# Patient Record
Sex: Female | Born: 1940 | Race: White | Hispanic: No | Smoking: Former smoker
Health system: Southern US, Community
[De-identification: ages and names within clinical notes are randomized; demographics above are authoritative.]

## PROBLEM LIST (undated history)

## (undated) DIAGNOSIS — F329 Major depressive disorder, single episode, unspecified: Secondary | ICD-10-CM

## (undated) DIAGNOSIS — Z9989 Dependence on other enabling machines and devices: Secondary | ICD-10-CM

## (undated) DIAGNOSIS — T8859XA Other complications of anesthesia, initial encounter: Secondary | ICD-10-CM

## (undated) DIAGNOSIS — T4145XA Adverse effect of unspecified anesthetic, initial encounter: Secondary | ICD-10-CM

## (undated) DIAGNOSIS — N200 Calculus of kidney: Secondary | ICD-10-CM

## (undated) DIAGNOSIS — F32A Depression, unspecified: Secondary | ICD-10-CM

## (undated) DIAGNOSIS — M8580 Other specified disorders of bone density and structure, unspecified site: Secondary | ICD-10-CM

## (undated) DIAGNOSIS — G4730 Sleep apnea, unspecified: Secondary | ICD-10-CM

## (undated) DIAGNOSIS — N3281 Overactive bladder: Secondary | ICD-10-CM

## (undated) DIAGNOSIS — K219 Gastro-esophageal reflux disease without esophagitis: Secondary | ICD-10-CM

## (undated) DIAGNOSIS — E7800 Pure hypercholesterolemia, unspecified: Secondary | ICD-10-CM

## (undated) DIAGNOSIS — K5792 Diverticulitis of intestine, part unspecified, without perforation or abscess without bleeding: Secondary | ICD-10-CM

## (undated) DIAGNOSIS — M5430 Sciatica, unspecified side: Secondary | ICD-10-CM

## (undated) DIAGNOSIS — E669 Obesity, unspecified: Secondary | ICD-10-CM

## (undated) HISTORY — DX: Gastro-esophageal reflux disease without esophagitis: K21.9

## (undated) HISTORY — DX: Diverticulitis of intestine, part unspecified, without perforation or abscess without bleeding: K57.92

## (undated) HISTORY — DX: Other specified disorders of bone density and structure, unspecified site: M85.80

## (undated) HISTORY — DX: Overactive bladder: N32.81

## (undated) HISTORY — DX: Depression, unspecified: F32.A

## (undated) HISTORY — DX: Obesity, unspecified: E66.9

## (undated) HISTORY — DX: Calculus of kidney: N20.0

## (undated) HISTORY — DX: Pure hypercholesterolemia, unspecified: E78.00

## (undated) HISTORY — DX: Sleep apnea, unspecified: G47.30

## (undated) HISTORY — DX: Dependence on other enabling machines and devices: Z99.89

## (undated) HISTORY — DX: Major depressive disorder, single episode, unspecified: F32.9

## (undated) HISTORY — DX: Sciatica, unspecified side: M54.30

---

## 1998-09-05 ENCOUNTER — Other Ambulatory Visit: Admission: RE | Admit: 1998-09-05 | Discharge: 1998-09-05 | Payer: Self-pay | Admitting: Obstetrics and Gynecology

## 1999-05-15 ENCOUNTER — Encounter: Payer: Self-pay | Admitting: Geriatric Medicine

## 1999-05-15 ENCOUNTER — Encounter: Admission: RE | Admit: 1999-05-15 | Discharge: 1999-05-15 | Payer: Self-pay | Admitting: Geriatric Medicine

## 1999-05-22 ENCOUNTER — Encounter: Payer: Self-pay | Admitting: *Deleted

## 1999-05-22 ENCOUNTER — Emergency Department (HOSPITAL_COMMUNITY): Admission: EM | Admit: 1999-05-22 | Discharge: 1999-05-22 | Payer: Self-pay | Admitting: *Deleted

## 1999-12-05 ENCOUNTER — Other Ambulatory Visit: Admission: RE | Admit: 1999-12-05 | Discharge: 1999-12-05 | Payer: Self-pay | Admitting: Obstetrics and Gynecology

## 2001-01-05 ENCOUNTER — Other Ambulatory Visit: Admission: RE | Admit: 2001-01-05 | Discharge: 2001-01-05 | Payer: Self-pay | Admitting: Obstetrics and Gynecology

## 2001-09-28 ENCOUNTER — Encounter: Payer: Self-pay | Admitting: Geriatric Medicine

## 2001-09-28 ENCOUNTER — Encounter: Admission: RE | Admit: 2001-09-28 | Discharge: 2001-09-28 | Payer: Self-pay | Admitting: Geriatric Medicine

## 2003-12-25 ENCOUNTER — Encounter: Admission: RE | Admit: 2003-12-25 | Discharge: 2003-12-25 | Payer: Self-pay | Admitting: Geriatric Medicine

## 2007-08-25 ENCOUNTER — Encounter: Admission: RE | Admit: 2007-08-25 | Discharge: 2007-08-25 | Payer: Self-pay | Admitting: Geriatric Medicine

## 2010-01-31 ENCOUNTER — Encounter: Admission: RE | Admit: 2010-01-31 | Discharge: 2010-01-31 | Payer: Self-pay | Admitting: Geriatric Medicine

## 2011-10-12 DIAGNOSIS — Z79899 Other long term (current) drug therapy: Secondary | ICD-10-CM | POA: Diagnosis not present

## 2011-10-22 DIAGNOSIS — M2010 Hallux valgus (acquired), unspecified foot: Secondary | ICD-10-CM | POA: Diagnosis not present

## 2011-12-25 DIAGNOSIS — Z1231 Encounter for screening mammogram for malignant neoplasm of breast: Secondary | ICD-10-CM | POA: Diagnosis not present

## 2012-03-30 DIAGNOSIS — L821 Other seborrheic keratosis: Secondary | ICD-10-CM | POA: Diagnosis not present

## 2012-06-01 DIAGNOSIS — Z23 Encounter for immunization: Secondary | ICD-10-CM | POA: Diagnosis not present

## 2012-06-29 DIAGNOSIS — R05 Cough: Secondary | ICD-10-CM | POA: Diagnosis not present

## 2012-06-29 DIAGNOSIS — G479 Sleep disorder, unspecified: Secondary | ICD-10-CM | POA: Diagnosis not present

## 2012-06-29 DIAGNOSIS — R5381 Other malaise: Secondary | ICD-10-CM | POA: Diagnosis not present

## 2012-06-29 DIAGNOSIS — Z0000 Encounter for general adult medical examination without abnormal findings: Secondary | ICD-10-CM | POA: Diagnosis not present

## 2012-06-29 DIAGNOSIS — R109 Unspecified abdominal pain: Secondary | ICD-10-CM | POA: Diagnosis not present

## 2012-06-29 DIAGNOSIS — Z1331 Encounter for screening for depression: Secondary | ICD-10-CM | POA: Diagnosis not present

## 2012-07-06 DIAGNOSIS — G4752 REM sleep behavior disorder: Secondary | ICD-10-CM | POA: Diagnosis not present

## 2012-07-06 DIAGNOSIS — G4733 Obstructive sleep apnea (adult) (pediatric): Secondary | ICD-10-CM | POA: Diagnosis not present

## 2012-07-18 DIAGNOSIS — Z961 Presence of intraocular lens: Secondary | ICD-10-CM | POA: Diagnosis not present

## 2012-07-18 DIAGNOSIS — D3130 Benign neoplasm of unspecified choroid: Secondary | ICD-10-CM | POA: Diagnosis not present

## 2012-07-18 DIAGNOSIS — H5231 Anisometropia: Secondary | ICD-10-CM | POA: Diagnosis not present

## 2012-07-18 DIAGNOSIS — H524 Presbyopia: Secondary | ICD-10-CM | POA: Diagnosis not present

## 2012-07-25 DIAGNOSIS — M25579 Pain in unspecified ankle and joints of unspecified foot: Secondary | ICD-10-CM | POA: Diagnosis not present

## 2012-07-25 DIAGNOSIS — M2010 Hallux valgus (acquired), unspecified foot: Secondary | ICD-10-CM | POA: Diagnosis not present

## 2012-08-08 DIAGNOSIS — G4733 Obstructive sleep apnea (adult) (pediatric): Secondary | ICD-10-CM | POA: Diagnosis not present

## 2012-08-17 DIAGNOSIS — D239 Other benign neoplasm of skin, unspecified: Secondary | ICD-10-CM | POA: Diagnosis not present

## 2012-08-17 DIAGNOSIS — M2010 Hallux valgus (acquired), unspecified foot: Secondary | ICD-10-CM | POA: Diagnosis not present

## 2012-08-17 DIAGNOSIS — L910 Hypertrophic scar: Secondary | ICD-10-CM | POA: Diagnosis not present

## 2012-08-17 DIAGNOSIS — L905 Scar conditions and fibrosis of skin: Secondary | ICD-10-CM | POA: Diagnosis not present

## 2012-08-22 DIAGNOSIS — J309 Allergic rhinitis, unspecified: Secondary | ICD-10-CM | POA: Diagnosis not present

## 2012-08-22 DIAGNOSIS — G479 Sleep disorder, unspecified: Secondary | ICD-10-CM | POA: Diagnosis not present

## 2012-08-22 DIAGNOSIS — F329 Major depressive disorder, single episode, unspecified: Secondary | ICD-10-CM | POA: Diagnosis not present

## 2012-09-06 DIAGNOSIS — Z4789 Encounter for other orthopedic aftercare: Secondary | ICD-10-CM | POA: Diagnosis not present

## 2012-09-12 DIAGNOSIS — Z4789 Encounter for other orthopedic aftercare: Secondary | ICD-10-CM | POA: Diagnosis not present

## 2012-09-15 DIAGNOSIS — Z4789 Encounter for other orthopedic aftercare: Secondary | ICD-10-CM | POA: Diagnosis not present

## 2012-09-19 DIAGNOSIS — M25579 Pain in unspecified ankle and joints of unspecified foot: Secondary | ICD-10-CM | POA: Diagnosis not present

## 2012-10-20 DIAGNOSIS — Z9889 Other specified postprocedural states: Secondary | ICD-10-CM | POA: Diagnosis not present

## 2012-11-09 DIAGNOSIS — E669 Obesity, unspecified: Secondary | ICD-10-CM | POA: Diagnosis not present

## 2012-11-09 DIAGNOSIS — G4733 Obstructive sleep apnea (adult) (pediatric): Secondary | ICD-10-CM | POA: Diagnosis not present

## 2012-11-09 DIAGNOSIS — F329 Major depressive disorder, single episode, unspecified: Secondary | ICD-10-CM | POA: Diagnosis not present

## 2012-12-26 DIAGNOSIS — Z1231 Encounter for screening mammogram for malignant neoplasm of breast: Secondary | ICD-10-CM | POA: Diagnosis not present

## 2013-03-29 DIAGNOSIS — L570 Actinic keratosis: Secondary | ICD-10-CM | POA: Diagnosis not present

## 2013-03-29 DIAGNOSIS — L821 Other seborrheic keratosis: Secondary | ICD-10-CM | POA: Diagnosis not present

## 2013-03-29 DIAGNOSIS — L723 Sebaceous cyst: Secondary | ICD-10-CM | POA: Diagnosis not present

## 2013-03-29 DIAGNOSIS — L719 Rosacea, unspecified: Secondary | ICD-10-CM | POA: Diagnosis not present

## 2013-03-29 DIAGNOSIS — L909 Atrophic disorder of skin, unspecified: Secondary | ICD-10-CM | POA: Diagnosis not present

## 2013-05-23 DIAGNOSIS — G4733 Obstructive sleep apnea (adult) (pediatric): Secondary | ICD-10-CM | POA: Diagnosis not present

## 2013-05-23 DIAGNOSIS — R0982 Postnasal drip: Secondary | ICD-10-CM | POA: Diagnosis not present

## 2013-05-23 DIAGNOSIS — Z79899 Other long term (current) drug therapy: Secondary | ICD-10-CM | POA: Diagnosis not present

## 2013-05-23 DIAGNOSIS — J387 Other diseases of larynx: Secondary | ICD-10-CM | POA: Diagnosis not present

## 2013-06-09 ENCOUNTER — Other Ambulatory Visit: Payer: Self-pay | Admitting: Neurology

## 2013-06-09 ENCOUNTER — Ambulatory Visit (INDEPENDENT_AMBULATORY_CARE_PROVIDER_SITE_OTHER): Payer: Medicare Other | Admitting: Neurology

## 2013-06-09 ENCOUNTER — Encounter: Payer: Self-pay | Admitting: Neurology

## 2013-06-09 ENCOUNTER — Encounter (INDEPENDENT_AMBULATORY_CARE_PROVIDER_SITE_OTHER): Payer: Self-pay

## 2013-06-09 VITALS — BP 123/63 | HR 63 | Resp 16 | Ht 63.0 in | Wt 192.0 lb

## 2013-06-09 DIAGNOSIS — E669 Obesity, unspecified: Secondary | ICD-10-CM | POA: Diagnosis not present

## 2013-06-09 DIAGNOSIS — G4733 Obstructive sleep apnea (adult) (pediatric): Secondary | ICD-10-CM | POA: Diagnosis not present

## 2013-06-09 DIAGNOSIS — G4752 REM sleep behavior disorder: Secondary | ICD-10-CM | POA: Diagnosis not present

## 2013-06-09 MED ORDER — CLONAZEPAM 0.5 MG PO TABS: 0.2500 mg | ORAL_TABLET | Freq: Every day | ORAL | Status: DC

## 2013-06-09 NOTE — Telephone Encounter (Signed)
Pt's prescription was faxed over to [facility] at [phone].

## 2013-06-09 NOTE — Patient Instructions (Signed)
Hypersomnia Hypersomnia usually brings recurrent episodes of excessive daytime sleepiness or prolonged nighttime sleep. It is different than feeling tired due to lack of or interrupted sleep at night. People with hypersomnia are compelled to nap repeatedly during the day. This is often at inappropriate times such as:  At work.  During a meal.  In conversation. These daytime naps usually provide no relief. This disorder typically affects adolescents and young adults. CAUSES  This condition may be caused by:  Another sleep disorder (such as narcolepsy or sleep apnea).  Dysfunction of the autonomic nervous system.  Drug or alcohol abuse.  A physical problem, such as:  A tumor.  Head trauma. This is damage caused by an accident.  Injury to the central nervous system.  Certain medications, or medicine withdrawal.  Medical conditions may contribute to the disorder, including:  Multiple sclerosis.  Depression.  Encephalitis.  Epilepsy.  Obesity.  Some people appear to have a genetic predisposition to this disorder. In others, there is no known cause. SYMPTOMS   Patients often have difficulty waking from a long sleep. They may feel dazed or confused.  Other symptoms may include:  Anxiety.  Increased irritation (inflammation).  Decreased energy.  Restlessness.  Slow thinking.  Slow speech.  Loss of appetite.  Hallucinations.  Memory difficulty.  Tremors, Tics.  Some patients lose the ability to function in family, social, occupational, or other settings. TREATMENT  Treatment is symptomatic in nature. Stimulants and other drugs may be used to treat this disorder. Changes in behavior may help. For example, avoid night work and social activities that delay bed time. Changes in diet may offer some relief. Patients should avoid alcohol and caffeine. PROGNOSIS  The likely outcome (prognosis) for persons with hypersomnia depends on the cause of the disorder.  The disorder itself is not life threatening. But it can have serious consequences. For example, automobile accidents can be caused by falling asleep while driving. The attacks usually continue indefinitely. Document Released: 07/03/2002 Document Revised: 10/05/2011 Document Reviewed: 06/06/2008 Exit[employer] Patient Information 2014 [employer].  

## 2013-06-09 NOTE — Progress Notes (Signed)
[facility]  Provider:  Melvyn Novas, M D  Referring Provider: No ref. provider found Primary Care Physician:  Ginette Otto, MD  Chief Complaint  Patient presents with  . SLEEP CONSULT    POSS OSA/REM DISORDER    HPI:  Cassandra Morrison is a 72 y.o. female  Is seen here as a referral/ revisit  from Dr. Pete Glatter.   Dear Dr. Pete Glatter, Thank you  very much for your referral of Cassandra Morrison for further evaluation of her sleep disorder.  Cassandra Morrison reports that about a year ago she had her first frightening events at night but she described as follows. She she was found asleep but apparently had left the bed and walked straight into a wall injuring herself when she got her a chest of drawers in her way. She  Walked for several yards asleep, she had no idea what made her walk towards the wall and trying to find a door when there was none. Her friends have noted her talking loud and clear in her sleep. She lives alone. Dr Pete Glatter discontinued her SSRI ( Prozac) , and this seemed to help several month - she had no further incidents.  Than she reports recurrent symptoms.  She had multiple injuries by now, all occurences in her bedroom, and one witnessed by her son , whom she visited in [place]. She begun keeping a record and moved fragile things out of her reach in the bedroom.  In [place], she fell on a bedside table and injured her left eye close to  the bridge of her nose.  The pain woke her. On night,  She found her bedroom in disarray , unaware in the morning what had happened - she had complete amnesia. All kinds of things were strewn over the floor.  She travelled to [place],  3 weeks ago- didn't take ehr CPAP with her.- and fell so hard that the top of her head remained sore for 14 days. She again woke up in pain, amnestic for the event itself.   She states that all the spells by now about 6 to be happening between 1:30 AM and 3 AM. She goes to bed around 10  Pm and reads until she falls asleep around 11.30 PM, she may have had 1-2 drinks with friends in the evening, not daily - but sees no correlation between the days she drank and those she didn't . Her dog wakes her up and may have been woken by her sleep behaviors around  1.30 -2 AM.  Both go quickly back to sleep after that bathroom break.  She does not need an alarm , wakes up by 7 AM spontaneously , and has no headaches, dry mouth - but noted drooling on occasion.  A polysomnography -[facility] , diagnosed with OSA ,titrated 12 cm water- and  By April and May compliant, according to download.  Failure of  REM atonia  was noted , and supposingly improved after Prozac was stopped.   Her Granddaughter diagnosed with night terrors at age 80 .    ROS :  EDS: Epworth  Today 13 points , on CPAP- she feels she needs to move and keep active not to get drowsy. She has some nights found the mask next to her.  She denies rigor, tremor, clumsiness, and has no loss of smell or taste . She used to have RLS but she stated she hadn't had symptoms for RLS .  No falls reported  in daytime,  no balance difficulties. She developed motion sickness over the last 3 years ,  had none since childhood. No migraines, but  Ice cream headaches.              History   Social History  . Marital Status: Divorced    Spouse Name: N/A    Number of Children: N/A  . Years of Education: N/A   Occupational History  . Not on file.   Social History Main Topics  . Smoking status: Former Smoker -- 0.50 packs/day    Types: Cigarettes  . Smokeless tobacco: Never Used     Comment: PT QUIT IN 74  . Alcohol Use: 2.4 - 3 oz/week    4-5 Glasses of wine per week     Comment: SOCIAL/CAFFINE DRINKER AS WELL  . Drug Use: No  . Sexual Activity: No   Other Topics Concern  . Not on file   Social History Narrative   PT LIVES AT HOME F/T RETIRED/DIVORCED   2 YRS OF COLLEGE   1 CHILD    Family History  Problem Relation Age  of Onset  . Cancer Maternal Grandmother     Past Medical History  Diagnosis Date  . Hypercholesterolemia   . Nephrolithiasis   . Diverticulitis   . Osteopenia   . GERD (gastroesophageal reflux disease)   . OAB (overactive bladder)   . Sleep apnea   . Sciatica   . Obesity   . Depression   . CPAP (continuous positive airway pressure) dependence     History reviewed. No pertinent past surgical history.  Current Outpatient Prescriptions  Medication Sig Dispense Refill  . budesonide (RHINOCORT AQUA) 32 MCG/ACT nasal spray Place 1 spray into both nostrils daily.      . calcium carbonate (OS-CAL) 600 MG TABS tablet Take 600 mg by mouth 2 (two) times daily with a meal.      . pantoprazole (PROTONIX) 40 MG tablet       . azelastine (ASTELIN) 137 MCG/SPRAY nasal spray       . MIRVASO 0.33 % GEL        No current facility-administered medications for this visit.    Allergies as of 06/09/2013 - Review Complete 06/09/2013  Allergen Reaction Noted  . Atropine sulfate  06/02/2013    Vitals: BP 123/63  Pulse 63  Resp 16  Ht 5\' 3"  (1.6 m)  Wt 192 lb (87.091 kg)  BMI 34.02 kg/m2 Last Weight:  Wt Readings from Last 1 Encounters:  06/09/13 192 lb (87.091 kg)   Last Height:   Ht Readings from Last 1 Encounters:  06/09/13 5\' 3"  (1.6 m)    Physical exam:  General: The patient is awake, alert and appears not in acute distress. The patient is well groomed. Head: Normocephalic, atraumatic. Neck is supple. Mallampati , 2 -14 , neck circumference:  Cardiovascular:  Regular rate and rhythm , without  murmurs or carotid bruit, and without distended neck veins. Respiratory: Lungs are clear to auscultation. Skin:  Without evidence of edema, or rash Trunk: BMI is  Elevated. This patient  has normal posture.  Neurologic exam : The patient is awake and alert, oriented to place and time.  Memory subjective  described as intact. There is a normal attention span & concentration ability.   Speech is fluent without  dysarthria, dysphonia or aphasia. Mood and affect are appropriate.  Cranial nerves: Pupils are equal and briskly reactive to light. Funduscopic exam without   evidence of pallor or  edema.  She has had bilateral lens replacement.  Extraocular movements  in vertical and horizontal planes intact and without nystagmus. Visual fields by finger perimetry are intact. Hearing to finger rub intact.  Facial sensation intact to fine touch.  Facial motor strength is symmetric and tongue and uvula move midline.  Motor exam:   Normal tone and normal muscle bulk and symmetric normal strength in all extremities. No cog wheeling and no tremor.  Sensory:  Fine touch, pinprick and vibration were tested in all extremities. Proprioception is normal.  Coordination: Rapid alternating movements in the fingers/hands is tested and normal. Finger-to-nose maneuver  without evidence of ataxia, dysmetria or tremor.  Gait and station: Patient walks without assistive device and is able and assisted stool climb up to the exam table. Strength within normal limits. Stance is stable and normal.  Tandem gait is unfragmented, remarkably well for age and BMI- she was able to toe walk but not heel stand- retropulsion.  . Romberg testing is normal.  Deep tendon reflexes: in the  upper and lower extremities are symmetric and intact. Babinski maneuver response is  downgoing.   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   Plan:  Treatment plan and additional workup : 1)Klonipin, safe while using CPAP.    Continue CPAP.

## 2013-06-19 ENCOUNTER — Telehealth: Payer: Self-pay | Admitting: Neurology

## 2013-06-20 NOTE — Telephone Encounter (Signed)
Patient states she does not want to take Klonipin 0.5mg  after reading side effects. Did not take med at all. Requesting to speak directly w/ Dohmeier about med and alternatives.

## 2013-06-21 NOTE — Telephone Encounter (Signed)
Spoke to patient and relayed the doctors message, she still doesn't want anything addictive and is asking for something else to use instead.

## 2013-06-21 NOTE — Telephone Encounter (Signed)
She can break this medication into quarters- i want her to use it sparingly. This is for REM BD .

## 2013-06-28 NOTE — Telephone Encounter (Signed)
She should try klonopin at home , in a safe environment for 3 night.  She can take the medication when she travels or sleeps at her sons house. CD

## 2013-07-05 DIAGNOSIS — M899 Disorder of bone, unspecified: Secondary | ICD-10-CM | POA: Diagnosis not present

## 2013-07-05 DIAGNOSIS — E7800 Pure hypercholesterolemia, unspecified: Secondary | ICD-10-CM | POA: Diagnosis not present

## 2013-07-05 DIAGNOSIS — Z0000 Encounter for general adult medical examination without abnormal findings: Secondary | ICD-10-CM | POA: Diagnosis not present

## 2013-07-10 DIAGNOSIS — H04129 Dry eye syndrome of unspecified lacrimal gland: Secondary | ICD-10-CM | POA: Diagnosis not present

## 2013-07-10 DIAGNOSIS — H43819 Vitreous degeneration, unspecified eye: Secondary | ICD-10-CM | POA: Diagnosis not present

## 2013-07-10 DIAGNOSIS — Z961 Presence of intraocular lens: Secondary | ICD-10-CM | POA: Diagnosis not present

## 2013-07-10 DIAGNOSIS — D3130 Benign neoplasm of unspecified choroid: Secondary | ICD-10-CM | POA: Diagnosis not present

## 2013-07-17 ENCOUNTER — Telehealth: Payer: Self-pay | Admitting: Neurology

## 2013-08-01 DIAGNOSIS — M899 Disorder of bone, unspecified: Secondary | ICD-10-CM | POA: Diagnosis not present

## 2013-09-04 ENCOUNTER — Ambulatory Visit
Admission: RE | Admit: 2013-09-04 | Discharge: 2013-09-04 | Disposition: A | Discharge status: Home / Self Care | Payer: Medicare Other | Source: Ambulatory Visit | Attending: Geriatric Medicine | Admitting: Geriatric Medicine

## 2013-09-04 ENCOUNTER — Other Ambulatory Visit: Payer: Self-pay | Admitting: Geriatric Medicine

## 2013-09-04 DIAGNOSIS — IMO0002 Reserved for concepts with insufficient information to code with codable children: Secondary | ICD-10-CM | POA: Diagnosis not present

## 2013-09-04 DIAGNOSIS — Z23 Encounter for immunization: Secondary | ICD-10-CM | POA: Diagnosis not present

## 2013-09-04 DIAGNOSIS — L989 Disorder of the skin and subcutaneous tissue, unspecified: Secondary | ICD-10-CM | POA: Diagnosis not present

## 2013-09-04 DIAGNOSIS — M1710 Unilateral primary osteoarthritis, unspecified knee: Secondary | ICD-10-CM | POA: Diagnosis not present

## 2013-09-04 DIAGNOSIS — M25561 Pain in right knee: Secondary | ICD-10-CM

## 2013-09-04 DIAGNOSIS — M25569 Pain in unspecified knee: Secondary | ICD-10-CM | POA: Diagnosis not present

## 2013-09-05 DIAGNOSIS — L259 Unspecified contact dermatitis, unspecified cause: Secondary | ICD-10-CM | POA: Diagnosis not present

## 2013-09-05 DIAGNOSIS — L821 Other seborrheic keratosis: Secondary | ICD-10-CM | POA: Diagnosis not present

## 2013-11-06 DIAGNOSIS — L819 Disorder of pigmentation, unspecified: Secondary | ICD-10-CM | POA: Diagnosis not present

## 2013-11-06 DIAGNOSIS — L723 Sebaceous cyst: Secondary | ICD-10-CM | POA: Diagnosis not present

## 2013-11-06 DIAGNOSIS — L821 Other seborrheic keratosis: Secondary | ICD-10-CM | POA: Diagnosis not present

## 2013-11-29 ENCOUNTER — Encounter: Payer: Self-pay | Admitting: Neurology

## 2013-11-29 ENCOUNTER — Ambulatory Visit (INDEPENDENT_AMBULATORY_CARE_PROVIDER_SITE_OTHER): Payer: Medicare Other | Admitting: Neurology

## 2013-11-29 ENCOUNTER — Encounter: Payer: Self-pay | Admitting: *Deleted

## 2013-11-29 VITALS — BP 138/74 | HR 84 | Resp 16 | Ht 62.0 in | Wt 174.0 lb

## 2013-11-29 DIAGNOSIS — Z9989 Dependence on other enabling machines and devices: Principal | ICD-10-CM

## 2013-11-29 DIAGNOSIS — G4750 Parasomnia, unspecified: Secondary | ICD-10-CM

## 2013-11-29 DIAGNOSIS — G4733 Obstructive sleep apnea (adult) (pediatric): Secondary | ICD-10-CM

## 2013-11-29 NOTE — Sleep Study (Signed)
Patient arrives for HST instruction.  Patient is given written instructions, picture instructions, and a demonstration on how to use HST unit.  All questions / concerns were addressed by technologist.  Financial responsibility was explained.  Follow up information was given to patient regarding how results would be received.  

## 2013-11-29 NOTE — Telephone Encounter (Signed)
Error

## 2013-11-29 NOTE — Progress Notes (Signed)
[facility]  Provider:  Larey Seat, M D  Referring Provider: Lajean Manes, MD Primary Care Physician:  Mathews Argyle, MD  Chief Complaint  Patient presents with  . Follow-up    Room 10  . Rem sleep behavior disorder    HPI:  Cassandra Morrison is a 73 y.o. female  Is seen here as a referral/ revisit  from Dr. Felipa Eth.   Dear Dr. Felipa Eth, Thank you very much for your referral of Cassandra Morrison for further evaluation of her sleep disorder.  This patient has been doing well since starting on her CPAP regularly and reportedly had no REM BD or acting out of dreams. She is taking 1/4mg  Tablet of Klonopin . She is keen on trying to get off, as she is afraid of addiction.  She feels that she sleeps not better because of the machine , feels she is still bothered by the mask, trying to adjusting it. The patient uses a nasal Pillow interface - pillairo.  She lost 20 pounds since last visit, would like to establish her OSA baseline.  Her CPAP download from 11-28-13 [place] a period of 90 days with a compliance rate of 92%, average daytime on CPAP therapy at 6 hours 25 minutes, residual AHI is 1.0. The setting of 12 cm water with 3 cm EPR. The median leak there is moderate. The CPAP was prescribed by Dr. Maxwell Caul. The patient would like to see if she is still in need of CPAP.    Therefore, I suggested the we'll try a home sleep test to confirm if apnea is still present after weight loss and after her therapy. It is important to know about untreated apnea also compatible nightmare this order is sleep walking sleep eating and even night terrors. A lot of insomnia also is related to untreated sleep-disordered breathing.  The patient endorsed to fatigue severity scale at only 10 points in the Epworth score at all points indicating that might she is on CPAP at least she is neither excessively sleepy nor fatigued.   She feels she gets 8 hours of sleep on average , with CPAP.       CONSULT note from November 2014 . Cassandra Morrison reports that about a year ago she had her first frightening events at night but she described as follows. She she was found asleep but apparently had left the bed and walked straight into a wall injuring herself when she got her a chest of drawers in her way. She walked for several yards asleep, she had no idea what made her walk towards the wall ,trying to find a door when there was none. Her friends have noted her talking loud and clear in her sleep. She lives alone. Dr. Felipa Eth discontinued her SSRI ( Prozac) , and this seemed to help several month - she had no further incidents.  Than she reports recurrent symptoms. She had multiple injuries by now, all occurences in her bedroom, and one witnessed by her son, whom she visited in [place]. She begun keeping a record and moved fragile things out of her reach in the bedroom. In [place], she fell on a bedside table and injured her left eye close to  the bridge of her nose.  The pain woke her. One night, she found her bedroom in disarray was unaware in the morning what had happened - she had complete amnesia.  All kinds of things were strewn over the floor. She travelled to [place], 3 weeks ago- didn't  take her CPAP with her.- and fell so hard that the top of her head remained sore for 14 days. She again woke up in pain, amnestic for the event itself.  She states that all the spells by now about 6 to be happening between 1:30 AM and 3 AM. She goes to bed around 10 Pm and reads until she falls asleep around 11.30 PM, she may have had 1-2 drinks with friends in the evening, not daily - but sees no correlation between the days she drank and those she didn't . Her dog wakes her up and may have been woken by her sleep behaviors around  1.30 -2 AM.  Both go quickly back to sleep after that bathroom break.  She does not need an alarm , wakes up by 7 AM spontaneously , and has no headaches, dry mouth - but  noted drooling on occasion.  A polysomnography - at [facility], diagnosed with OSA , titrated 12 cm water- and by April and May compliant, according to download. Failure of REM atonia was noted , and supposingly improved after Prozac was stopped.   Her Granddaughter diagnosed with night terrors at age 38 .        ROS :  EDS: Epworth today 2 points down from 13 points , on CPAP! It works.             History   Social History  . Marital Status: Divorced    Spouse Name: N/A    Number of Children: 1  . Years of Education: College   Occupational History  . Not on file.   Social History Main Topics  . Smoking status: Former Smoker -- 0.50 packs/day    Types: Cigarettes  . Smokeless tobacco: Never Used     Comment: PT QUIT IN 57  . Alcohol Use: No     Comment: CAFFINE DRINKER  . Drug Use: No  . Sexual Activity: No   Other Topics Concern  . Not on file   Social History Narrative   PT LIVES AT HOME F/T RETIRED/DIVORCED   2 [place]   1 CHILD   Patient drinks two cups of coffee every morning.   Patient is right-handed.    Family History  Problem Relation Age of Onset  . Cancer Maternal Grandmother     Past Medical History  Diagnosis Date  . Hypercholesterolemia   . Nephrolithiasis   . Diverticulitis   . Osteopenia   . GERD (gastroesophageal reflux disease)   . OAB (overactive bladder)   . Sleep apnea   . Sciatica   . Obesity   . Depression   . CPAP (continuous positive airway pressure) dependence     History reviewed. No pertinent past surgical history.  Current Outpatient Prescriptions  Medication Sig Dispense Refill  . budesonide (RHINOCORT AQUA) 32 MCG/ACT nasal spray Place 1 spray into both nostrils daily.      . calcium carbonate (OS-CAL) 600 MG TABS tablet Take 600 mg by mouth 2 (two) times daily with a meal.      . clonazePAM (KLONOPIN) 0.5 MG tablet Take 0.5 tablets (0.25 mg total) by mouth at bedtime.  30 tablet  5   No current  facility-administered medications for this visit.    Allergies as of 11/29/2013 - Review Complete 11/29/2013  Allergen Reaction Noted  . Atropine sulfate  06/02/2013    Vitals: BP 138/74  Pulse 84  Resp 16  Ht 5\' 2"  (1.575 m)  Wt 174  lb (78.926 kg)  BMI 31.82 kg/m2 Last Weight:  Wt Readings from Last 1 Encounters:  11/29/13 174 lb (78.926 kg)   Last Height:   Ht Readings from Last 1 Encounters:  11/29/13 5\' 2"  (1.575 m)    Physical exam:  General: The patient is awake, alert and appears not in acute distress. The patient is well groomed. Head: Normocephalic, atraumatic. Neck is supple. Mallampati , 2 -14 , neck circumference:  Cardiovascular:  Regular rate and rhythm , without  murmurs or carotid bruit, and without distended neck veins. Respiratory: Lungs are clear to auscultation. Skin:  Without evidence of edema, or rash Trunk: BMI is  Elevated. This patient  has normal posture.  Neurologic exam : The patient is awake and alert, oriented to place and time.  Memory subjective  described as intact. There is a normal attention span & concentration ability.  Speech is fluent without  dysarthria, dysphonia or aphasia. Mood and affect are appropriate.  Cranial nerves: Pupils are equal and briskly reactive to light. Funduscopic exam without   evidence of pallor or edema.  She has had bilateral lens replacement.  Extraocular movements  in vertical and horizontal planes intact and without nystagmus. Visual fields by finger perimetry are intact. Hearing to finger rub intact.  Facial sensation intact to fine touch.  Facial motor strength is symmetric and tongue and uvula move midline.  Motor exam:   Normal tone and normal muscle bulk and symmetric normal strength in all extremities. No cogwheeling and no tremor.  Sensory:  Fine touch, pinprick and vibration were tested in all extremities. Proprioception is normal.  Coordination: Rapid alternating movements in the fingers/hands  is tested and normal. Finger-to-nose maneuver  without evidence of ataxia, dysmetria or tremor.  Gait and station: Patient walks without assistive device and is able and assisted stool climb up to the exam table. Strength within normal limits. Stance is stable and normal.  Tandem gait is unfragmented, remarkably well for age and BMI- she was able to toe walk but not heel stand- retropulsion.  . Romberg testing is normal.  Deep tendon reflexes: in the  upper and lower extremities are symmetric and intact. Babinski maneuver response is  downgoing.   Assessment:  After physical and neurologic examination, review of laboratory studies, imaging, neurophysiology testing and pre-existing records, assessment is   EDS resolved, no more fatigue on CPAP - she lost weight , is interested to know if she still needs CPAP. I suggested a HST to  document her new baseline.    Plan:  Treatment plan and additional workup :  HST to confirm current baseline of OSA, patient should continue CPAP use except for one night for HST.  Will call if any adjustments are to be made.  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  Perhaps due to CPAP, too.

## 2013-11-30 NOTE — Sleep Study (Signed)
Patient returns HST unit and wonders if it worked, she had inadvertently placed the strap inside out when pulling it over her head last night, she also states she was awake because of her dog more than usual.  I took the device and downloaded it, there were just under 8 hours of valid data recorded and signals looked pretty good.  Some issues with effort but not significant.  AHI showing 1 after manual score.  Report and screen shots prepared and data will be forwarded to Dr. Brett Fairy for her review.

## 2013-12-13 ENCOUNTER — Ambulatory Visit: Payer: Medicare Other | Admitting: Neurology

## 2013-12-14 ENCOUNTER — Encounter: Payer: Self-pay | Admitting: *Deleted

## 2013-12-14 ENCOUNTER — Telehealth: Payer: Self-pay | Admitting: Neurology

## 2013-12-14 NOTE — Telephone Encounter (Signed)
I called and spoke with the patient about her recent HST results. 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 I informed the patient that Dr. Brett Fairy recommend seeing a Cardiologist, so I will send a referral over to Dr. An[facility]. I will fax a copy of the report to Dr. Carlyle Lipa office and mail a copy to the patient.

## 2013-12-27 DIAGNOSIS — Z1231 Encounter for screening mammogram for malignant neoplasm of breast: Secondary | ICD-10-CM | POA: Diagnosis not present

## 2014-01-29 ENCOUNTER — Encounter: Payer: Self-pay | Admitting: Interventional Cardiology

## 2014-01-29 ENCOUNTER — Ambulatory Visit (INDEPENDENT_AMBULATORY_CARE_PROVIDER_SITE_OTHER): Payer: Medicare Other | Admitting: Interventional Cardiology

## 2014-01-29 VITALS — BP 135/76 | HR 73 | Ht 62.0 in | Wt 173.0 lb

## 2014-01-29 DIAGNOSIS — I471 Supraventricular tachycardia, unspecified: Secondary | ICD-10-CM

## 2014-01-29 DIAGNOSIS — R002 Palpitations: Secondary | ICD-10-CM | POA: Diagnosis not present

## 2014-01-29 DIAGNOSIS — I447 Left bundle-branch block, unspecified: Secondary | ICD-10-CM

## 2014-01-29 DIAGNOSIS — R0789 Other chest pain: Secondary | ICD-10-CM

## 2014-01-29 NOTE — Progress Notes (Signed)
Patient ID: Cassandra [place], female   DOB: [birth year], 73 y.o.   MRN: [id number]   Date: 01/29/2014 ID: Cassandra [place], DOB 1941-04-17, MRN [id number] PCP: Mathews Argyle, MD  z Reason: Varying heart rate and chest tightness  ASSESSMENT;  1. Chest tightness, nocturnal with recumbency 2. Heart rate variability noted on sleep study with heart rates as high as 180 beats per minute 3. Left bundle branch block 4. Sleep apnea  PLAN:  1. 48 hour Holter monitor to rule out the possibility of tachybradycardia syndrome based on findings from sleep study 2. Pharmacologic nuclear study   SUBJECTIVE: Cassandra Morrison is a 73 y.o. female who is here for evaluation of significant heart rate variability noted on a recent sleep study. Heart rates range between 50 and 188 beats per minute. The patient additionally complains of chest tightness with recumbency most evenings. There is no exertional complaint. She has an abnormal EKG with incomplete left bundle branch block as well as a family history of coronary artery disease.   Allergies  Allergen Reactions  . Atropine Sulfate     Hallucinations    Current Outpatient Prescriptions on File Prior to Visit  Medication Sig Dispense Refill  . budesonide (RHINOCORT AQUA) 32 MCG/ACT nasal spray Place 1 spray into both nostrils daily.      . calcium carbonate (OS-CAL) 600 MG TABS tablet Take 600 mg by mouth 2 (two) times daily with a meal.       No current facility-administered medications on file prior to visit.    Past Medical History  Diagnosis Date  . Hypercholesterolemia   . Nephrolithiasis   . Diverticulitis   . Osteopenia   . GERD (gastroesophageal reflux disease)   . OAB (overactive bladder)   . Sleep apnea   . Sciatica   . Obesity   . Depression   . CPAP (continuous positive airway pressure) dependence     No past surgical history on file.  History   Social History  . Marital Status: Divorced    Spouse Name: N/A   Number of Children: 1  . Years of Education: College   Occupational History  . Not on file.   Social History Main Topics  . Smoking status: Former Smoker -- 0.50 packs/day    Types: Cigarettes  . Smokeless tobacco: Never Used     Comment: PT QUIT IN 41  . Alcohol Use: No     Comment: [employer]  . Drug Use: No  . Sexual Activity: No   Other Topics Concern  . Not on file   Social History Narrative   PT LIVES AT HOME F/T RETIRED/DIVORCED   2 [place]   1 CHILD   Patient drinks two cups of coffee every morning.   Patient is right-handed.    Family History  Problem Relation Age of Onset  . Cancer Maternal Grandmother     ROS: No transient neurological complaints. She denies syncope. She denies palpitations but has a sense of fullness when she lies down some nights. The sense of fullness is in the suprasternal notch.. Other systems negative for complaints.  OBJECTIVE: BP 135/76  Pulse 73  Ht 5\' 2"  (1.575 m)  Wt 173 lb (78.472 kg)  BMI 31.63 kg/m2,  General: No acute distress, mildly obese HEENT: normal without jaundice Neck: JVD flat. Carotids absent Chest: Clear Cardiac: Murmur: 1/6 systolic murmur right upper sternal border. Gallop: S4. Rhythm: Normal. Other: Normal Abdomen: Bruit: Absent. Pulsation: Absent Extremities: Edema: Absent.  Pulses: 2+ Neuro: Normal Psych: Normal  ECG: Normal sinus rhythm with incomplete left bundle branch block. No change when compared to EKG from 2010 at [place].  ECHOCARDIOGRAM, 2010: Mild aortic regurgitation, mild left atrial enlargement, normal LV function

## 2014-01-29 NOTE — Patient Instructions (Signed)
Your physician recommends that you continue on your current medications as directed. Please refer to the Current Medication list given to you today.  Your physician has requested that you have a lexiscan myoview. For further information please visit [email or web address]. Please follow instruction sheet, as given.  Your physician has recommended that you wear a holter monitor. Holter monitors are medical devices that record the heart's electrical activity. Doctors most often use these monitors to diagnose arrhythmias. Arrhythmias are problems with the speed or rhythm of the heartbeat. The monitor is a small, portable device. You can wear one while you do your normal daily activities. This is usually used to diagnose what is causing palpitations/syncope (passing out).   Follow  up pending results

## 2014-01-30 ENCOUNTER — Encounter (INDEPENDENT_AMBULATORY_CARE_PROVIDER_SITE_OTHER): Payer: Medicare Other

## 2014-01-30 ENCOUNTER — Encounter: Payer: Self-pay | Admitting: *Deleted

## 2014-01-30 DIAGNOSIS — R002 Palpitations: Secondary | ICD-10-CM | POA: Diagnosis not present

## 2014-01-30 NOTE — Progress Notes (Signed)
Patient ID: Cassandra [place], female   DOB: 01-Jul-1941, 73 y.o.   MRN: [id number] [facility] 48 Hour holter monitor applied to patient.

## 2014-02-07 ENCOUNTER — Ambulatory Visit (HOSPITAL_COMMUNITY): Payer: Medicare Other | Attending: Cardiovascular Disease | Admitting: Radiology

## 2014-02-07 VITALS — BP 131/73 | HR 59 | Ht 62.0 in | Wt 170.0 lb

## 2014-02-07 DIAGNOSIS — R0789 Other chest pain: Secondary | ICD-10-CM | POA: Insufficient documentation

## 2014-02-07 DIAGNOSIS — R079 Chest pain, unspecified: Secondary | ICD-10-CM | POA: Diagnosis not present

## 2014-02-07 DIAGNOSIS — I447 Left bundle-branch block, unspecified: Secondary | ICD-10-CM

## 2014-02-07 MED ORDER — TECHNETIUM TC 99M SESTAMIBI GENERIC - CARDIOLITE
11.0000 | Freq: Once | INTRAVENOUS | Status: AC | PRN
Start: 2014-02-07 — End: 2014-02-07
  Administered 2014-02-07: 11 via INTRAVENOUS

## 2014-02-07 MED ORDER — ADENOSINE (DIAGNOSTIC) 3 MG/ML IV SOLN
0.5600 mg/kg | Freq: Once | INTRAVENOUS | Status: AC
  Administered 2014-02-07: 43.2 mg via INTRAVENOUS

## 2014-02-07 MED ORDER — TECHNETIUM TC 99M SESTAMIBI GENERIC - CARDIOLITE
33.0000 | Freq: Once | INTRAVENOUS | Status: AC | PRN
  Administered 2014-02-07: 33 via INTRAVENOUS

## 2014-02-07 NOTE — Progress Notes (Signed)
Cassandra Morrison [address] [place], [name] [address] [phone]    Cardiology Nuclear Morrison Study  Cassandra Morrison is a 73 y.o. female     MRN : [id number]     DOB: 1941/02/09  Procedure Date: 02/07/2014  Nuclear Morrison Background Indication for Stress Test:  Evaluation for Ischemia and Abnormal EKG (LBBB) History:  No known CAD, Echo 2010 (normal), OSA - CPAP Cardiac Risk Factors: Family History - CAD, History of Smoking, LBBB and Lipids  Symptoms:  Chest Tightness (last date of chest discomfort was two days ago)   Nuclear Pre-Procedure Caffeine/Decaff Intake:  None> 12 hrs NPO After: 9:00pm   Lungs:  clear O2 Sat: 97% on room air. IV 0.9% NS with Angio Cath:  22g  IV Site: R Antecubital x 1, tolerated well IV Started by:  Irven Baltimore, RN  Chest Size (in):  40 Cup Size: C  Height: 5\' 2"  (1.575 m)  Weight:  170 lb (77.111 kg)  BMI:  Body mass index is 31.09 kg/(m^2). Tech Comments:  N/A    Nuclear Morrison Study 1 or 2 day study: 1 day  Stress Test Type:  Adenosine  Reading MD: N/A  Order Authorizing Provider:  Daneen Schick, MD  Resting Radionuclide: Technetium 57m Sestamibi  Resting Radionuclide Dose: 11.0 mCi   Stress Radionuclide:  Technetium 80m Sestamibi  Stress Radionuclide Dose: 33.0 mCi           Stress Protocol Rest HR: 59 Stress HR: 83  Rest BP: 131/73 Stress BP: 130/61  Exercise Time (min): n/a METS: n/a           Dose of Adenosine (mg):  43.3 mg Dose of Lexiscan: n/a mg  Dose of Atropine (mg): n/a Dose of Dobutamine: n/a mcg/kg/min (at max HR)  Stress Test Technologist: Glade Lloyd, BS-ES  Nuclear Technologist:  Vedia Pereyra, CNMT     Rest Procedure:  Myocardial perfusion imaging was performed at rest 45 minutes following the intravenous administration of Technetium 7m Sestamibi. Rest ECG: NSR - Normal EKG, Atrial Fibrilliation and NSR,    poor R wave progression  Stress Procedure:  The patient received IV adenosine at  140 mcg/kg/min for 4 minutes.  Technetium 4m Sestamibi was injected at the 2 minute mark and quantitative spect images were obtained after a 45 minute delay.  During the infusion of Adenosine the patient complained of neck and chest tightness, tingling hands, weakness, stomach discomfort and head hurting.  These symptoms completely resolved in recovery.  Stress ECG: No significant change from baseline ECG  QPS Raw Data Images:  Normal; no motion artifact; normal heart/lung ratio. Stress Images:  Normal homogeneous uptake in all areas of the myocardium. Rest Images:  Normal homogeneous uptake in all areas of the myocardium. Subtraction (SDS):  No evidence of ischemia. Transient Ischemic Dilatation (Normal <1.22):  .93 Lung/Heart Ratio (Normal <0.45):  .24  Quantitative Gated Spect Images QGS EDV:  89 ml QGS ESV:  26 ml  Impression Exercise Capacity:  Adenosine study with no exercise. BP Response:  Normal blood pressure response. Clinical Symptoms:  No significant symptoms noted. ECG Impression:  No significant ST segment change suggestive of ischemia. Comparison with Prior Nuclear Study: No images to compare  Overall Impression:  Normal stress nuclear study.  LV Ejection Fraction: 71%.  LV Wall Motion:  NL LV Function; NL Wall Motion.   Thayer Headings, Brooke Bonito., MD, [facility] 02/07/2014, 4:36 PM [address],  [address]  Office - [phone] Pager [phone]

## 2014-02-09 ENCOUNTER — Telehealth: Payer: Self-pay | Admitting: Cardiology

## 2014-02-09 NOTE — Telephone Encounter (Signed)
Dr. Thompson Caul Interpretation of 48 hour holter monitor: Normal Study

## 2014-02-09 NOTE — Telephone Encounter (Signed)
Pt.notified

## 2014-06-06 DIAGNOSIS — Z23 Encounter for immunization: Secondary | ICD-10-CM | POA: Diagnosis not present

## 2014-07-06 DIAGNOSIS — Z0000 Encounter for general adult medical examination without abnormal findings: Secondary | ICD-10-CM | POA: Diagnosis not present

## 2014-07-06 DIAGNOSIS — E780 Pure hypercholesterolemia: Secondary | ICD-10-CM | POA: Diagnosis not present

## 2014-07-06 DIAGNOSIS — F325 Major depressive disorder, single episode, in full remission: Secondary | ICD-10-CM | POA: Diagnosis not present

## 2014-07-06 DIAGNOSIS — Z79899 Other long term (current) drug therapy: Secondary | ICD-10-CM | POA: Diagnosis not present

## 2014-07-06 DIAGNOSIS — Z1389 Encounter for screening for other disorder: Secondary | ICD-10-CM | POA: Diagnosis not present

## 2014-07-12 DIAGNOSIS — H04123 Dry eye syndrome of bilateral lacrimal glands: Secondary | ICD-10-CM | POA: Diagnosis not present

## 2014-07-12 DIAGNOSIS — E780 Pure hypercholesterolemia: Secondary | ICD-10-CM | POA: Diagnosis not present

## 2014-07-12 DIAGNOSIS — Z79899 Other long term (current) drug therapy: Secondary | ICD-10-CM | POA: Diagnosis not present

## 2014-07-12 DIAGNOSIS — H16103 Unspecified superficial keratitis, bilateral: Secondary | ICD-10-CM | POA: Diagnosis not present

## 2014-07-12 DIAGNOSIS — Z961 Presence of intraocular lens: Secondary | ICD-10-CM | POA: Diagnosis not present

## 2014-07-12 DIAGNOSIS — H5213 Myopia, bilateral: Secondary | ICD-10-CM | POA: Diagnosis not present

## 2014-07-26 DIAGNOSIS — R159 Full incontinence of feces: Secondary | ICD-10-CM | POA: Diagnosis not present

## 2014-07-26 DIAGNOSIS — N3941 Urge incontinence: Secondary | ICD-10-CM | POA: Diagnosis not present

## 2014-07-30 DIAGNOSIS — Z23 Encounter for immunization: Secondary | ICD-10-CM | POA: Diagnosis not present

## 2014-08-23 DIAGNOSIS — R159 Full incontinence of feces: Secondary | ICD-10-CM | POA: Diagnosis not present

## 2014-08-23 DIAGNOSIS — N3941 Urge incontinence: Secondary | ICD-10-CM | POA: Diagnosis not present

## 2014-11-02 DIAGNOSIS — N3946 Mixed incontinence: Secondary | ICD-10-CM | POA: Diagnosis not present

## 2014-11-02 DIAGNOSIS — R159 Full incontinence of feces: Secondary | ICD-10-CM | POA: Diagnosis not present

## 2014-12-26 DIAGNOSIS — Z1231 Encounter for screening mammogram for malignant neoplasm of breast: Secondary | ICD-10-CM | POA: Diagnosis not present

## 2015-01-15 DIAGNOSIS — I8312 Varicose veins of left lower extremity with inflammation: Secondary | ICD-10-CM | POA: Diagnosis not present

## 2015-01-15 DIAGNOSIS — D225 Melanocytic nevi of trunk: Secondary | ICD-10-CM | POA: Diagnosis not present

## 2015-01-15 DIAGNOSIS — I8311 Varicose veins of right lower extremity with inflammation: Secondary | ICD-10-CM | POA: Diagnosis not present

## 2015-01-15 DIAGNOSIS — L821 Other seborrheic keratosis: Secondary | ICD-10-CM | POA: Diagnosis not present

## 2015-01-15 DIAGNOSIS — L718 Other rosacea: Secondary | ICD-10-CM | POA: Diagnosis not present

## 2015-01-15 DIAGNOSIS — L814 Other melanin hyperpigmentation: Secondary | ICD-10-CM | POA: Diagnosis not present

## 2015-01-21 DIAGNOSIS — Z6832 Body mass index (BMI) 32.0-32.9, adult: Secondary | ICD-10-CM | POA: Diagnosis not present

## 2015-01-21 DIAGNOSIS — I8393 Asymptomatic varicose veins of bilateral lower extremities: Secondary | ICD-10-CM | POA: Diagnosis not present

## 2015-01-21 DIAGNOSIS — E669 Obesity, unspecified: Secondary | ICD-10-CM | POA: Diagnosis not present

## 2015-01-22 DIAGNOSIS — G3184 Mild cognitive impairment, so stated: Secondary | ICD-10-CM | POA: Diagnosis not present

## 2015-03-14 ENCOUNTER — Other Ambulatory Visit: Payer: Self-pay | Admitting: Gastroenterology

## 2015-05-07 ENCOUNTER — Encounter: Payer: Self-pay | Admitting: *Deleted

## 2015-05-08 ENCOUNTER — Encounter (INDEPENDENT_AMBULATORY_CARE_PROVIDER_SITE_OTHER): Payer: Medicare Other | Admitting: *Deleted

## 2015-05-08 DIAGNOSIS — I868 Varicose veins of other specified sites: Secondary | ICD-10-CM

## 2015-05-09 DIAGNOSIS — Z23 Encounter for immunization: Secondary | ICD-10-CM | POA: Diagnosis not present

## 2015-05-10 ENCOUNTER — Encounter (HOSPITAL_COMMUNITY): Payer: Self-pay | Admitting: *Deleted

## 2015-05-20 ENCOUNTER — Ambulatory Visit (HOSPITAL_COMMUNITY): Payer: Medicare Other | Admitting: Anesthesiology

## 2015-05-20 ENCOUNTER — Encounter (HOSPITAL_COMMUNITY): Payer: Self-pay | Admitting: Anesthesiology

## 2015-05-20 ENCOUNTER — Ambulatory Visit (HOSPITAL_COMMUNITY)
Admission: RE | Admit: 2015-05-20 | Discharge: 2015-05-20 | Disposition: A | Discharge status: Home / Self Care | Payer: Medicare Other | Source: Ambulatory Visit | Attending: Gastroenterology | Admitting: Gastroenterology

## 2015-05-20 ENCOUNTER — Encounter (HOSPITAL_COMMUNITY)
Admission: RE | Disposition: A | Discharge status: Home / Self Care | Payer: Self-pay | Source: Ambulatory Visit | Attending: Gastroenterology

## 2015-05-20 DIAGNOSIS — K219 Gastro-esophageal reflux disease without esophagitis: Secondary | ICD-10-CM | POA: Insufficient documentation

## 2015-05-20 DIAGNOSIS — E669 Obesity, unspecified: Secondary | ICD-10-CM | POA: Diagnosis not present

## 2015-05-20 DIAGNOSIS — K5790 Diverticulosis of intestine, part unspecified, without perforation or abscess without bleeding: Secondary | ICD-10-CM | POA: Diagnosis not present

## 2015-05-20 DIAGNOSIS — K5730 Diverticulosis of large intestine without perforation or abscess without bleeding: Secondary | ICD-10-CM | POA: Insufficient documentation

## 2015-05-20 DIAGNOSIS — G4733 Obstructive sleep apnea (adult) (pediatric): Secondary | ICD-10-CM | POA: Diagnosis not present

## 2015-05-20 DIAGNOSIS — Z87891 Personal history of nicotine dependence: Secondary | ICD-10-CM | POA: Diagnosis not present

## 2015-05-20 DIAGNOSIS — Z86010 Personal history of colonic polyps: Secondary | ICD-10-CM | POA: Diagnosis not present

## 2015-05-20 DIAGNOSIS — D123 Benign neoplasm of transverse colon: Secondary | ICD-10-CM | POA: Diagnosis not present

## 2015-05-20 DIAGNOSIS — G709 Myoneural disorder, unspecified: Secondary | ICD-10-CM | POA: Diagnosis not present

## 2015-05-20 DIAGNOSIS — E7800 Pure hypercholesterolemia, unspecified: Secondary | ICD-10-CM | POA: Insufficient documentation

## 2015-05-20 DIAGNOSIS — K635 Polyp of colon: Secondary | ICD-10-CM | POA: Diagnosis not present

## 2015-05-20 DIAGNOSIS — Z1211 Encounter for screening for malignant neoplasm of colon: Secondary | ICD-10-CM | POA: Insufficient documentation

## 2015-05-20 DIAGNOSIS — Z6832 Body mass index (BMI) 32.0-32.9, adult: Secondary | ICD-10-CM | POA: Diagnosis not present

## 2015-05-20 HISTORY — DX: Other complications of anesthesia, initial encounter: T88.59XA

## 2015-05-20 HISTORY — PX: COLONOSCOPY WITH PROPOFOL: SHX5780

## 2015-05-20 HISTORY — DX: Adverse effect of unspecified anesthetic, initial encounter: T41.45XA

## 2015-05-20 LAB — GLUCOSE, CAPILLARY: GLUCOSE-CAPILLARY: 94 mg/dL (ref 65–99)

## 2015-05-20 SURGERY — COLONOSCOPY WITH PROPOFOL
Anesthesia: Monitor Anesthesia Care

## 2015-05-20 MED ORDER — PROPOFOL 10 MG/ML IV BOLUS
INTRAVENOUS | Status: AC
  Filled 2015-05-20: qty 20

## 2015-05-20 MED ORDER — SODIUM CHLORIDE 0.9 % IV SOLN: INTRAVENOUS | Status: DC

## 2015-05-20 MED ORDER — PROPOFOL 10 MG/ML IV BOLUS
INTRAVENOUS | Status: DC | PRN
  Administered 2015-05-20 (×2): 40 mg via INTRAVENOUS
  Administered 2015-05-20: 20 mg via INTRAVENOUS
  Administered 2015-05-20 (×2): 40 mg via INTRAVENOUS
  Administered 2015-05-20 (×2): 20 mg via INTRAVENOUS
  Administered 2015-05-20: 40 mg via INTRAVENOUS

## 2015-05-20 MED ORDER — LACTATED RINGERS IV SOLN
INTRAVENOUS | Status: DC | PRN
  Administered 2015-05-20: 07:00:00 via INTRAVENOUS

## 2015-05-20 MED ORDER — LACTATED RINGERS IV SOLN: INTRAVENOUS | Status: DC

## 2015-05-20 SURGICAL SUPPLY — 21 items

## 2015-05-20 NOTE — H&P (Signed)
  Procedure: Surveillance colonoscopy. 08/27/2009 colonoscopy performed with removal of two 3 mm adenomatous descending colon polyps  History: The patient is a 74 year old female born 1940-12-25. She is scheduled to undergo a surveillance colonoscopy with polypectomy to prevent colon cancer.  Past medical history: Hypercholesterolemia. Kidney stones. Colonic diverticulosis. Depression. Overactive bladder. Gastroesophageal reflux. Obstructive sleep apnea syndrome. Total abdominal hysterectomy. One ovary removed surgically. Sinus surgery. Bladder suspension surgery.  Medication allergies: Atropine caused hallucinations  Exam: The patient is alert and lying comfortably on the endoscopy stretcher. Abdomen is soft and nontender to palpation. Cardiac exam reveals a regular rhythm. Lungs are clear to auscultation.  Plan: Proceed with surveillance colonoscopy

## 2015-05-20 NOTE — Transfer of Care (Signed)
Immediate Anesthesia Transfer of Care Note  Patient: Cassandra [place]  Procedure(s) Performed: Procedure(s): COLONOSCOPY WITH PROPOFOL (N/A)  Patient Location: PACU and Endoscopy Unit  Anesthesia Type:MAC  Level of Consciousness: awake, oriented, patient cooperative, lethargic and responds to stimulation  Airway & Oxygen Therapy: Patient Spontanous Breathing and Patient connected to face mask oxygen  Post-op Assessment: Report given to RN, Post -op Vital signs reviewed and stable and Patient moving all extremities  Post vital signs: Reviewed and stable  Last Vitals:  Filed Vitals:   05/20/15 0731  BP: 153/66  Pulse: 82  Temp: 36.7 C  Resp: 14    Complications: No apparent anesthesia complications

## 2015-05-20 NOTE — Op Note (Signed)
Procedure: Surveillance colonoscopy. 08/27/2009 colonoscopy performed with removal of two 3 mm adenomatous ascending colon polyps  Endoscopist: Earle Gell  Premedication: Propofol administered by anesthesia  Procedure: The patient was placed in the left lateral decubitus position. Anal inspection and digital rectal exam were normal. The Pentax pediatric colonoscope was introduced into the rectum and advanced to the cecum. A normal-appearing appendiceal orifice and ileocecal valve were identified. Colonic preparation for the exam today was good. Withdrawal time was 9 minutes  Rectum. Normal. Retroflexed view of the distal rectum was normal  Sigmoid colon and descending colon. Left colonic diverticulosis  Splenic flexure. Normal  Transverse colon. From the distal transverse colon, a 3 mm sessile polyp was removed with the cold biopsy forceps  Hepatic flexure. Normal  Ascending colon. Normal  Cecum and ileocecal valve. Normal  Assessment: A diminutive polyp was removed from the distal transverse colon. Otherwise normal surveillance colonoscopy.

## 2015-05-20 NOTE — Anesthesia Postprocedure Evaluation (Signed)
  Anesthesia Post-op Note  Patient: Cassandra [place]  Procedure(s) Performed: Procedure(s) (LRB): COLONOSCOPY WITH PROPOFOL (N/A)  Patient Location: PACU  Anesthesia Type: MAC  Level of Consciousness: awake and alert   Airway and Oxygen Therapy: Patient Spontanous Breathing  Post-op Pain: mild  Post-op Assessment: Post-op Vital signs reviewed, Patient's Cardiovascular Status Stable, Respiratory Function Stable, Patent Airway and No signs of Nausea or vomiting  Last Vitals:  Filed Vitals:   05/20/15 0910  BP: 147/52  Pulse: 58  Temp:   Resp: 17    Post-op Vital Signs: stable   Complications: No apparent anesthesia complications

## 2015-05-20 NOTE — Anesthesia Preprocedure Evaluation (Addendum)
Anesthesia Evaluation  Patient identified by MRN, date of birth, ID band Patient awake    Reviewed: Allergy & Precautions, NPO status , Patient's Chart, lab work & pertinent test results  Airway Mallampati: II  TM Distance: >3 FB Neck ROM: Full    Dental no notable dental hx.    Pulmonary sleep apnea , former smoker,    Pulmonary exam normal breath sounds clear to auscultation       Cardiovascular negative cardio ROS Normal cardiovascular exam Rhythm:Regular Rate:Normal  Nuclear heart study normal 02-09-14 Dr. Tamala Julian   Neuro/Psych PSYCHIATRIC DISORDERS Depression  Neuromuscular disease    GI/Hepatic Neg liver ROS, GERD  Medicated,  Endo/Other  negative endocrine ROS  Renal/GU Renal disease  negative genitourinary   Musculoskeletal negative musculoskeletal ROS (+)   Abdominal (+) + obese,   Peds negative pediatric ROS (+)  Hematology negative hematology ROS (+)   Anesthesia Other Findings   Reproductive/Obstetrics negative OB ROS                            Anesthesia Physical Anesthesia Plan  ASA: II  Anesthesia Plan: MAC   Post-op Pain Management:    Induction: Intravenous  Airway Management Planned: Natural Airway  Additional Equipment:   Intra-op Plan:   Post-operative Plan:   Informed Consent: I have reviewed the patients History and Physical, chart, labs and discussed the procedure including the risks, benefits and alternatives for the proposed anesthesia with the patient or authorized representative who has indicated his/her understanding and acceptance.   Dental advisory given  Plan Discussed with: CRNA  Anesthesia Plan Comments:         Anesthesia Quick Evaluation

## 2015-05-20 NOTE — Discharge Instructions (Signed)
Colon Polyps Polyps are lumps of extra tissue growing inside the body. Polyps can grow in the large intestine (colon). Most colon polyps are noncancerous (benign). However, some colon polyps can become cancerous over time. Polyps that are larger than a pea may be harmful. To be safe, caregivers remove and test all polyps. CAUSES  Polyps form when mutations in the genes cause your cells to grow and divide even though no more tissue is needed. RISK FACTORS There are a number of risk factors that can increase your chances of getting colon polyps. They include:  Being older than 50 years.  Family history of colon polyps or colon cancer.  Long-term colon diseases, such as colitis or Crohn disease.  Being overweight.  Smoking.  Being inactive.  Drinking too much alcohol. SYMPTOMS  Most small polyps do not cause symptoms. If symptoms are present, they may include:  Blood in the stool. The stool may look dark red or black.  Constipation or diarrhea that lasts longer than 1 week. DIAGNOSIS People often do not know they have polyps until their caregiver finds them during a regular checkup. Your caregiver can use 4 tests to check for polyps:  Digital rectal exam. The caregiver wears gloves and feels inside the rectum. This test would find polyps only in the rectum.  Barium enema. The caregiver puts a liquid called barium into your rectum before taking X-rays of your colon. Barium makes your colon look white. Polyps are dark, so they are easy to see in the X-ray pictures.  Sigmoidoscopy. A thin, flexible tube (sigmoidoscope) is placed into your rectum. The sigmoidoscope has a light and tiny camera in it. The caregiver uses the sigmoidoscope to look at the last third of your colon.  Colonoscopy. This test is like sigmoidoscopy, but the caregiver looks at the entire colon. This is the most common method for finding and removing polyps. TREATMENT  Any polyps will be removed during a  sigmoidoscopy or colonoscopy. The polyps are then tested for cancer. PREVENTION  To help lower your risk of getting more colon polyps:  Eat plenty of fruits and vegetables. Avoid eating fatty foods.  Do not smoke.  Avoid drinking alcohol.  Exercise every day.  Lose weight if recommended by your caregiver.  Eat plenty of calcium and folate. Foods that are rich in calcium include milk, cheese, and broccoli. Foods that are rich in folate include chickpeas, kidney beans, and spinach. HOME CARE INSTRUCTIONS Keep all follow-up appointments as directed by your caregiver. You may need periodic exams to check for polyps. SEEK MEDICAL CARE IF: You notice bleeding during a bowel movement.   This information is not intended to replace advice given to you by your health care provider. Make sure you discuss any questions you have with your health care provider.   Document Released: 04/08/2004 Document Revised: 08/03/2014 Document Reviewed: 09/22/2011 Elsevier Interactive Patient Education 2016 [employer] Colonoscopy, Care After Refer to this sheet in the next few weeks. These instructions provide you with information on caring for yourself after your procedure. Your health care provider may also give you more specific instructions. Your treatment has been planned according to current medical practices, but problems sometimes occur. Call your health care provider if you have any problems or questions after your procedure. WHAT TO EXPECT AFTER THE PROCEDURE  After your procedure, it is typical to have the following:  A small amount of blood in your stool.  Moderate amounts of gas and mild abdominal cramping or bloating.  HOME CARE INSTRUCTIONS  Do not drive, operate machinery, or sign important documents for 24 hours.  You may shower and resume your regular physical activities, but move at a slower pace for the first 24 hours.  Take frequent rest periods for the first 24 hours.  Walk  around or put a warm pack on your abdomen to help reduce abdominal cramping and bloating.  Drink enough fluids to keep your urine clear or pale yellow.  You may resume your normal diet as instructed by your health care provider. Avoid heavy or fried foods that are hard to digest.  Avoid drinking alcohol for 24 hours or as instructed by your health care provider.  Only take over-the-counter or prescription medicines as directed by your health care provider.  If a tissue sample (biopsy) was taken during your procedure:  Do not take aspirin or blood thinners for 7 days, or as instructed by your health care provider.  Do not drink alcohol for 7 days, or as instructed by your health care provider.  Eat soft foods for the first 24 hours. SEEK MEDICAL CARE IF: You have persistent spotting of blood in your stool 2-3 days after the procedure. SEEK IMMEDIATE MEDICAL CARE IF:  You have more than a small spotting of blood in your stool.  You pass large blood clots in your stool.  Your abdomen is swollen (distended).  You have nausea or vomiting.  You have a fever.  You have increasing abdominal pain that is not relieved with medicine.   This information is not intended to replace advice given to you by your health care provider. Make sure you discuss any questions you have with your health care provider.   Document Released: 02/25/2004 Document Revised: 05/03/2013 Document Reviewed: 03/20/2013 Elsevier Interactive Patient Education [employer].

## 2015-05-21 ENCOUNTER — Encounter (HOSPITAL_COMMUNITY): Payer: Self-pay | Admitting: Gastroenterology

## 2015-05-27 ENCOUNTER — Encounter: Payer: Self-pay | Admitting: *Deleted

## 2015-05-29 ENCOUNTER — Ambulatory Visit (INDEPENDENT_AMBULATORY_CARE_PROVIDER_SITE_OTHER): Payer: Medicare Other | Admitting: *Deleted

## 2015-05-29 DIAGNOSIS — I8393 Asymptomatic varicose veins of bilateral lower extremities: Secondary | ICD-10-CM

## 2015-05-29 DIAGNOSIS — I781 Nevus, non-neoplastic: Secondary | ICD-10-CM

## 2015-05-29 NOTE — Progress Notes (Signed)
X=.3% Sotradecol administered with a 27g butterfly.  Patient received a total of 6cc.  Treated major areas of concern for this nice lady. Easy access. Tol well. Anticipate good results. Follow prn  Photos: No.  Compression stockings applied: Yes.  and aces over feet and ankles for compression of ankle veins. (Thighs larger than ankles/calves so worried the stockings were not tight enough.)

## 2015-06-12 ENCOUNTER — Ambulatory Visit: Payer: Medicare Other | Admitting: *Deleted

## 2015-06-18 ENCOUNTER — Ambulatory Visit (INDEPENDENT_AMBULATORY_CARE_PROVIDER_SITE_OTHER): Payer: Medicare Other | Admitting: *Deleted

## 2015-06-18 DIAGNOSIS — I781 Nevus, non-neoplastic: Secondary | ICD-10-CM

## 2015-06-18 NOTE — Progress Notes (Signed)
Pt came in wanting me to look at a place on her achilles tendon which she thought was where I had injected a spider vein. I referred to my notes and there was no vein there that I injected. The area looks like a scrape rather than any area where sclero was performed. Pt was reassured. Follow prn.

## 2015-07-12 DIAGNOSIS — Z1389 Encounter for screening for other disorder: Secondary | ICD-10-CM | POA: Diagnosis not present

## 2015-07-12 DIAGNOSIS — Z0000 Encounter for general adult medical examination without abnormal findings: Secondary | ICD-10-CM | POA: Diagnosis not present

## 2015-07-12 DIAGNOSIS — R0789 Other chest pain: Secondary | ICD-10-CM | POA: Diagnosis not present

## 2015-07-15 DIAGNOSIS — D3132 Benign neoplasm of left choroid: Secondary | ICD-10-CM | POA: Diagnosis not present

## 2015-07-15 DIAGNOSIS — Z961 Presence of intraocular lens: Secondary | ICD-10-CM | POA: Diagnosis not present

## 2015-07-15 DIAGNOSIS — Z0100 Encounter for examination of eyes and vision without abnormal findings: Secondary | ICD-10-CM | POA: Diagnosis not present

## 2015-07-15 DIAGNOSIS — H04123 Dry eye syndrome of bilateral lacrimal glands: Secondary | ICD-10-CM | POA: Diagnosis not present

## 2015-10-08 DIAGNOSIS — L820 Inflamed seborrheic keratosis: Secondary | ICD-10-CM | POA: Diagnosis not present

## 2015-10-08 DIAGNOSIS — D1801 Hemangioma of skin and subcutaneous tissue: Secondary | ICD-10-CM | POA: Diagnosis not present

## 2015-10-08 DIAGNOSIS — L720 Epidermal cyst: Secondary | ICD-10-CM | POA: Diagnosis not present

## 2015-10-08 DIAGNOSIS — D225 Melanocytic nevi of trunk: Secondary | ICD-10-CM | POA: Diagnosis not present

## 2015-11-14 DIAGNOSIS — R05 Cough: Secondary | ICD-10-CM | POA: Diagnosis not present

## 2015-11-14 DIAGNOSIS — J301 Allergic rhinitis due to pollen: Secondary | ICD-10-CM | POA: Diagnosis not present

## 2015-11-14 DIAGNOSIS — L03011 Cellulitis of right finger: Secondary | ICD-10-CM | POA: Diagnosis not present

## 2015-11-20 DIAGNOSIS — L03011 Cellulitis of right finger: Secondary | ICD-10-CM | POA: Diagnosis not present

## 2015-12-02 DIAGNOSIS — R52 Pain, unspecified: Secondary | ICD-10-CM | POA: Diagnosis not present

## 2015-12-02 DIAGNOSIS — M19041 Primary osteoarthritis, right hand: Secondary | ICD-10-CM | POA: Diagnosis not present

## 2015-12-13 DIAGNOSIS — L03011 Cellulitis of right finger: Secondary | ICD-10-CM | POA: Diagnosis not present

## 2015-12-20 DIAGNOSIS — M19041 Primary osteoarthritis, right hand: Secondary | ICD-10-CM | POA: Diagnosis not present

## 2015-12-20 DIAGNOSIS — R52 Pain, unspecified: Secondary | ICD-10-CM | POA: Diagnosis not present

## 2015-12-27 DIAGNOSIS — Z1231 Encounter for screening mammogram for malignant neoplasm of breast: Secondary | ICD-10-CM | POA: Diagnosis not present

## 2016-04-06 DIAGNOSIS — H16102 Unspecified superficial keratitis, left eye: Secondary | ICD-10-CM | POA: Diagnosis not present

## 2016-04-06 DIAGNOSIS — H1132 Conjunctival hemorrhage, left eye: Secondary | ICD-10-CM | POA: Diagnosis not present

## 2016-05-09 DIAGNOSIS — Z23 Encounter for immunization: Secondary | ICD-10-CM | POA: Diagnosis not present

## 2016-07-16 DIAGNOSIS — H04123 Dry eye syndrome of bilateral lacrimal glands: Secondary | ICD-10-CM | POA: Diagnosis not present

## 2016-07-16 DIAGNOSIS — G43909 Migraine, unspecified, not intractable, without status migrainosus: Secondary | ICD-10-CM | POA: Diagnosis not present

## 2016-07-16 DIAGNOSIS — D3132 Benign neoplasm of left choroid: Secondary | ICD-10-CM | POA: Diagnosis not present

## 2016-07-16 DIAGNOSIS — H524 Presbyopia: Secondary | ICD-10-CM | POA: Diagnosis not present

## 2016-08-14 DIAGNOSIS — Z0000 Encounter for general adult medical examination without abnormal findings: Secondary | ICD-10-CM | POA: Diagnosis not present

## 2016-08-14 DIAGNOSIS — E7800 Pure hypercholesterolemia, unspecified: Secondary | ICD-10-CM | POA: Diagnosis not present

## 2016-08-14 DIAGNOSIS — Z79899 Other long term (current) drug therapy: Secondary | ICD-10-CM | POA: Diagnosis not present

## 2016-08-14 DIAGNOSIS — E669 Obesity, unspecified: Secondary | ICD-10-CM | POA: Diagnosis not present

## 2016-08-14 DIAGNOSIS — F325 Major depressive disorder, single episode, in full remission: Secondary | ICD-10-CM | POA: Diagnosis not present

## 2016-08-14 DIAGNOSIS — M151 Heberden's nodes (with arthropathy): Secondary | ICD-10-CM | POA: Diagnosis not present

## 2016-08-20 DIAGNOSIS — L720 Epidermal cyst: Secondary | ICD-10-CM | POA: Diagnosis not present

## 2016-08-20 DIAGNOSIS — L814 Other melanin hyperpigmentation: Secondary | ICD-10-CM | POA: Diagnosis not present

## 2016-08-20 DIAGNOSIS — Z419 Encounter for procedure for purposes other than remedying health state, unspecified: Secondary | ICD-10-CM | POA: Diagnosis not present

## 2016-08-20 DIAGNOSIS — L821 Other seborrheic keratosis: Secondary | ICD-10-CM | POA: Diagnosis not present

## 2016-08-27 DIAGNOSIS — N3941 Urge incontinence: Secondary | ICD-10-CM | POA: Diagnosis not present

## 2016-10-20 DIAGNOSIS — R0982 Postnasal drip: Secondary | ICD-10-CM | POA: Diagnosis not present

## 2016-11-11 DIAGNOSIS — L918 Other hypertrophic disorders of the skin: Secondary | ICD-10-CM | POA: Diagnosis not present

## 2016-11-11 DIAGNOSIS — L814 Other melanin hyperpigmentation: Secondary | ICD-10-CM | POA: Diagnosis not present

## 2016-11-11 DIAGNOSIS — L821 Other seborrheic keratosis: Secondary | ICD-10-CM | POA: Diagnosis not present

## 2016-11-11 DIAGNOSIS — D1801 Hemangioma of skin and subcutaneous tissue: Secondary | ICD-10-CM | POA: Diagnosis not present

## 2016-11-11 DIAGNOSIS — L718 Other rosacea: Secondary | ICD-10-CM | POA: Diagnosis not present

## 2016-11-11 DIAGNOSIS — D225 Melanocytic nevi of trunk: Secondary | ICD-10-CM | POA: Diagnosis not present

## 2016-12-30 DIAGNOSIS — Z1231 Encounter for screening mammogram for malignant neoplasm of breast: Secondary | ICD-10-CM | POA: Diagnosis not present

## 2017-02-25 ENCOUNTER — Ambulatory Visit
Admission: RE | Admit: 2017-02-25 | Discharge: 2017-02-25 | Disposition: A | Discharge status: Home / Self Care | Payer: Medicare Other | Source: Ambulatory Visit | Attending: Geriatric Medicine | Admitting: Geriatric Medicine

## 2017-02-25 ENCOUNTER — Other Ambulatory Visit: Payer: Self-pay | Admitting: Geriatric Medicine

## 2017-02-25 DIAGNOSIS — M79605 Pain in left leg: Secondary | ICD-10-CM

## 2017-03-26 DIAGNOSIS — M26609 Unspecified temporomandibular joint disorder, unspecified side: Secondary | ICD-10-CM | POA: Diagnosis not present

## 2017-03-26 DIAGNOSIS — H903 Sensorineural hearing loss, bilateral: Secondary | ICD-10-CM | POA: Diagnosis not present

## 2017-03-26 DIAGNOSIS — H9201 Otalgia, right ear: Secondary | ICD-10-CM | POA: Diagnosis not present

## 2017-04-28 DIAGNOSIS — Z23 Encounter for immunization: Secondary | ICD-10-CM | POA: Diagnosis not present

## 2017-07-07 DIAGNOSIS — H5212 Myopia, left eye: Secondary | ICD-10-CM | POA: Diagnosis not present

## 2017-07-07 DIAGNOSIS — D3132 Benign neoplasm of left choroid: Secondary | ICD-10-CM | POA: Diagnosis not present

## 2017-07-07 DIAGNOSIS — H04123 Dry eye syndrome of bilateral lacrimal glands: Secondary | ICD-10-CM | POA: Diagnosis not present

## 2017-07-07 DIAGNOSIS — H353131 Nonexudative age-related macular degeneration, bilateral, early dry stage: Secondary | ICD-10-CM | POA: Diagnosis not present

## 2017-08-17 DIAGNOSIS — Z79899 Other long term (current) drug therapy: Secondary | ICD-10-CM | POA: Diagnosis not present

## 2017-08-17 DIAGNOSIS — F325 Major depressive disorder, single episode, in full remission: Secondary | ICD-10-CM | POA: Diagnosis not present

## 2017-08-17 DIAGNOSIS — Z6834 Body mass index (BMI) 34.0-34.9, adult: Secondary | ICD-10-CM | POA: Diagnosis not present

## 2017-08-17 DIAGNOSIS — Z1389 Encounter for screening for other disorder: Secondary | ICD-10-CM | POA: Diagnosis not present

## 2017-08-17 DIAGNOSIS — Z0000 Encounter for general adult medical examination without abnormal findings: Secondary | ICD-10-CM | POA: Diagnosis not present

## 2017-08-17 DIAGNOSIS — R413 Other amnesia: Secondary | ICD-10-CM | POA: Diagnosis not present

## 2017-08-17 DIAGNOSIS — E669 Obesity, unspecified: Secondary | ICD-10-CM | POA: Diagnosis not present

## 2017-08-17 DIAGNOSIS — E7800 Pure hypercholesterolemia, unspecified: Secondary | ICD-10-CM | POA: Diagnosis not present

## 2017-08-17 DIAGNOSIS — Z23 Encounter for immunization: Secondary | ICD-10-CM | POA: Diagnosis not present

## 2017-08-25 DIAGNOSIS — D3132 Benign neoplasm of left choroid: Secondary | ICD-10-CM | POA: Diagnosis not present

## 2017-08-25 DIAGNOSIS — H43813 Vitreous degeneration, bilateral: Secondary | ICD-10-CM | POA: Diagnosis not present

## 2017-08-25 DIAGNOSIS — H353132 Nonexudative age-related macular degeneration, bilateral, intermediate dry stage: Secondary | ICD-10-CM | POA: Diagnosis not present

## 2017-09-14 DIAGNOSIS — H353132 Nonexudative age-related macular degeneration, bilateral, intermediate dry stage: Secondary | ICD-10-CM | POA: Diagnosis not present

## 2017-10-14 DIAGNOSIS — H353132 Nonexudative age-related macular degeneration, bilateral, intermediate dry stage: Secondary | ICD-10-CM | POA: Diagnosis not present

## 2017-11-11 DIAGNOSIS — D692 Other nonthrombocytopenic purpura: Secondary | ICD-10-CM | POA: Diagnosis not present

## 2017-11-11 DIAGNOSIS — L718 Other rosacea: Secondary | ICD-10-CM | POA: Diagnosis not present

## 2017-11-11 DIAGNOSIS — L814 Other melanin hyperpigmentation: Secondary | ICD-10-CM | POA: Diagnosis not present

## 2017-11-11 DIAGNOSIS — D225 Melanocytic nevi of trunk: Secondary | ICD-10-CM | POA: Diagnosis not present

## 2017-11-11 DIAGNOSIS — L821 Other seborrheic keratosis: Secondary | ICD-10-CM | POA: Diagnosis not present

## 2017-11-11 DIAGNOSIS — D1801 Hemangioma of skin and subcutaneous tissue: Secondary | ICD-10-CM | POA: Diagnosis not present

## 2017-11-11 DIAGNOSIS — I788 Other diseases of capillaries: Secondary | ICD-10-CM | POA: Diagnosis not present

## 2017-11-11 DIAGNOSIS — L738 Other specified follicular disorders: Secondary | ICD-10-CM | POA: Diagnosis not present

## 2017-11-13 DIAGNOSIS — H353132 Nonexudative age-related macular degeneration, bilateral, intermediate dry stage: Secondary | ICD-10-CM | POA: Diagnosis not present

## 2017-12-13 DIAGNOSIS — H353132 Nonexudative age-related macular degeneration, bilateral, intermediate dry stage: Secondary | ICD-10-CM | POA: Diagnosis not present

## 2018-01-05 DIAGNOSIS — H353132 Nonexudative age-related macular degeneration, bilateral, intermediate dry stage: Secondary | ICD-10-CM | POA: Diagnosis not present

## 2018-01-05 DIAGNOSIS — H5213 Myopia, bilateral: Secondary | ICD-10-CM | POA: Diagnosis not present

## 2018-01-05 DIAGNOSIS — H16103 Unspecified superficial keratitis, bilateral: Secondary | ICD-10-CM | POA: Diagnosis not present

## 2018-01-05 DIAGNOSIS — S99922A Unspecified injury of left foot, initial encounter: Secondary | ICD-10-CM | POA: Diagnosis not present

## 2018-01-05 DIAGNOSIS — H04123 Dry eye syndrome of bilateral lacrimal glands: Secondary | ICD-10-CM | POA: Diagnosis not present

## 2018-01-07 DIAGNOSIS — Z1231 Encounter for screening mammogram for malignant neoplasm of breast: Secondary | ICD-10-CM | POA: Diagnosis not present

## 2018-01-12 DIAGNOSIS — H353132 Nonexudative age-related macular degeneration, bilateral, intermediate dry stage: Secondary | ICD-10-CM | POA: Diagnosis not present

## 2018-01-13 DIAGNOSIS — S92525D Nondisplaced fracture of medial phalanx of left lesser toe(s), subsequent encounter for fracture with routine healing: Secondary | ICD-10-CM | POA: Diagnosis not present

## 2018-02-10 DIAGNOSIS — S92525D Nondisplaced fracture of medial phalanx of left lesser toe(s), subsequent encounter for fracture with routine healing: Secondary | ICD-10-CM | POA: Diagnosis not present

## 2018-02-10 DIAGNOSIS — S99922A Unspecified injury of left foot, initial encounter: Secondary | ICD-10-CM | POA: Diagnosis not present

## 2018-02-11 DIAGNOSIS — H353132 Nonexudative age-related macular degeneration, bilateral, intermediate dry stage: Secondary | ICD-10-CM | POA: Diagnosis not present

## 2018-03-03 DIAGNOSIS — S92912D Unspecified fracture of left toe(s), subsequent encounter for fracture with routine healing: Secondary | ICD-10-CM | POA: Diagnosis not present

## 2018-03-13 DIAGNOSIS — H353132 Nonexudative age-related macular degeneration, bilateral, intermediate dry stage: Secondary | ICD-10-CM | POA: Diagnosis not present

## 2018-03-31 DIAGNOSIS — S92525D Nondisplaced fracture of medial phalanx of left lesser toe(s), subsequent encounter for fracture with routine healing: Secondary | ICD-10-CM | POA: Diagnosis not present

## 2018-04-07 DIAGNOSIS — M26622 Arthralgia of left temporomandibular joint: Secondary | ICD-10-CM | POA: Diagnosis not present

## 2018-04-07 DIAGNOSIS — H903 Sensorineural hearing loss, bilateral: Secondary | ICD-10-CM | POA: Diagnosis not present

## 2018-04-07 DIAGNOSIS — R238 Other skin changes: Secondary | ICD-10-CM | POA: Diagnosis not present

## 2018-04-07 DIAGNOSIS — H9202 Otalgia, left ear: Secondary | ICD-10-CM | POA: Diagnosis not present

## 2018-04-07 DIAGNOSIS — T753XXD Motion sickness, subsequent encounter: Secondary | ICD-10-CM | POA: Diagnosis not present

## 2018-04-12 DIAGNOSIS — H353132 Nonexudative age-related macular degeneration, bilateral, intermediate dry stage: Secondary | ICD-10-CM | POA: Diagnosis not present

## 2018-04-27 DIAGNOSIS — Z23 Encounter for immunization: Secondary | ICD-10-CM | POA: Diagnosis not present

## 2018-05-12 DIAGNOSIS — H353132 Nonexudative age-related macular degeneration, bilateral, intermediate dry stage: Secondary | ICD-10-CM | POA: Diagnosis not present

## 2018-06-11 DIAGNOSIS — H353132 Nonexudative age-related macular degeneration, bilateral, intermediate dry stage: Secondary | ICD-10-CM | POA: Diagnosis not present

## 2018-07-11 DIAGNOSIS — H02831 Dermatochalasis of right upper eyelid: Secondary | ICD-10-CM | POA: Diagnosis not present

## 2018-07-11 DIAGNOSIS — H04123 Dry eye syndrome of bilateral lacrimal glands: Secondary | ICD-10-CM | POA: Diagnosis not present

## 2018-07-11 DIAGNOSIS — H43813 Vitreous degeneration, bilateral: Secondary | ICD-10-CM | POA: Diagnosis not present

## 2018-07-11 DIAGNOSIS — H353132 Nonexudative age-related macular degeneration, bilateral, intermediate dry stage: Secondary | ICD-10-CM | POA: Diagnosis not present

## 2018-07-14 DIAGNOSIS — Z9889 Other specified postprocedural states: Secondary | ICD-10-CM | POA: Diagnosis not present

## 2018-07-14 DIAGNOSIS — M19071 Primary osteoarthritis, right ankle and foot: Secondary | ICD-10-CM | POA: Diagnosis not present

## 2018-07-14 DIAGNOSIS — M216X2 Other acquired deformities of left foot: Secondary | ICD-10-CM | POA: Diagnosis not present

## 2018-07-14 DIAGNOSIS — Z888 Allergy status to other drugs, medicaments and biological substances status: Secondary | ICD-10-CM | POA: Diagnosis not present

## 2018-07-14 DIAGNOSIS — M7732 Calcaneal spur, left foot: Secondary | ICD-10-CM | POA: Diagnosis not present

## 2018-07-14 DIAGNOSIS — M79672 Pain in left foot: Secondary | ICD-10-CM | POA: Diagnosis not present

## 2018-07-14 DIAGNOSIS — M79671 Pain in right foot: Secondary | ICD-10-CM | POA: Diagnosis not present

## 2018-08-10 DIAGNOSIS — H353132 Nonexudative age-related macular degeneration, bilateral, intermediate dry stage: Secondary | ICD-10-CM | POA: Diagnosis not present

## 2018-08-26 DIAGNOSIS — Z1389 Encounter for screening for other disorder: Secondary | ICD-10-CM | POA: Diagnosis not present

## 2018-08-26 DIAGNOSIS — Z0000 Encounter for general adult medical examination without abnormal findings: Secondary | ICD-10-CM | POA: Diagnosis not present

## 2018-08-26 DIAGNOSIS — Z79899 Other long term (current) drug therapy: Secondary | ICD-10-CM | POA: Diagnosis not present

## 2018-08-26 DIAGNOSIS — R42 Dizziness and giddiness: Secondary | ICD-10-CM | POA: Diagnosis not present

## 2018-08-26 DIAGNOSIS — E7800 Pure hypercholesterolemia, unspecified: Secondary | ICD-10-CM | POA: Diagnosis not present

## 2018-08-26 DIAGNOSIS — F325 Major depressive disorder, single episode, in full remission: Secondary | ICD-10-CM | POA: Diagnosis not present

## 2018-09-08 DIAGNOSIS — H04122 Dry eye syndrome of left lacrimal gland: Secondary | ICD-10-CM | POA: Diagnosis not present

## 2018-09-08 DIAGNOSIS — H04121 Dry eye syndrome of right lacrimal gland: Secondary | ICD-10-CM | POA: Diagnosis not present

## 2018-09-09 DIAGNOSIS — H353132 Nonexudative age-related macular degeneration, bilateral, intermediate dry stage: Secondary | ICD-10-CM | POA: Diagnosis not present

## 2018-09-27 DIAGNOSIS — H04123 Dry eye syndrome of bilateral lacrimal glands: Secondary | ICD-10-CM | POA: Diagnosis not present

## 2018-10-09 DIAGNOSIS — H353132 Nonexudative age-related macular degeneration, bilateral, intermediate dry stage: Secondary | ICD-10-CM | POA: Diagnosis not present

## 2018-11-08 DIAGNOSIS — H353132 Nonexudative age-related macular degeneration, bilateral, intermediate dry stage: Secondary | ICD-10-CM | POA: Diagnosis not present

## 2018-11-24 DIAGNOSIS — L853 Xerosis cutis: Secondary | ICD-10-CM | POA: Diagnosis not present

## 2018-11-24 DIAGNOSIS — L821 Other seborrheic keratosis: Secondary | ICD-10-CM | POA: Diagnosis not present

## 2018-11-24 DIAGNOSIS — D225 Melanocytic nevi of trunk: Secondary | ICD-10-CM | POA: Diagnosis not present

## 2018-11-24 DIAGNOSIS — L720 Epidermal cyst: Secondary | ICD-10-CM | POA: Diagnosis not present

## 2018-11-24 DIAGNOSIS — L700 Acne vulgaris: Secondary | ICD-10-CM | POA: Diagnosis not present

## 2018-11-24 DIAGNOSIS — D1801 Hemangioma of skin and subcutaneous tissue: Secondary | ICD-10-CM | POA: Diagnosis not present

## 2018-11-24 DIAGNOSIS — L814 Other melanin hyperpigmentation: Secondary | ICD-10-CM | POA: Diagnosis not present

## 2019-01-03 DIAGNOSIS — Z1231 Encounter for screening mammogram for malignant neoplasm of breast: Secondary | ICD-10-CM | POA: Diagnosis not present

## 2019-01-06 DIAGNOSIS — H353132 Nonexudative age-related macular degeneration, bilateral, intermediate dry stage: Secondary | ICD-10-CM | POA: Diagnosis not present

## 2019-01-16 DIAGNOSIS — H353131 Nonexudative age-related macular degeneration, bilateral, early dry stage: Secondary | ICD-10-CM | POA: Diagnosis not present

## 2019-01-16 DIAGNOSIS — H16103 Unspecified superficial keratitis, bilateral: Secondary | ICD-10-CM | POA: Diagnosis not present

## 2019-01-16 DIAGNOSIS — H04123 Dry eye syndrome of bilateral lacrimal glands: Secondary | ICD-10-CM | POA: Diagnosis not present

## 2019-01-16 DIAGNOSIS — H52203 Unspecified astigmatism, bilateral: Secondary | ICD-10-CM | POA: Diagnosis not present

## 2019-01-18 DIAGNOSIS — H16102 Unspecified superficial keratitis, left eye: Secondary | ICD-10-CM | POA: Diagnosis not present

## 2019-01-18 DIAGNOSIS — H04122 Dry eye syndrome of left lacrimal gland: Secondary | ICD-10-CM | POA: Diagnosis not present

## 2019-01-20 DIAGNOSIS — H5711 Ocular pain, right eye: Secondary | ICD-10-CM | POA: Diagnosis not present

## 2019-01-20 DIAGNOSIS — T1511XA Foreign body in conjunctival sac, right eye, initial encounter: Secondary | ICD-10-CM | POA: Diagnosis not present

## 2019-02-05 DIAGNOSIS — H353132 Nonexudative age-related macular degeneration, bilateral, intermediate dry stage: Secondary | ICD-10-CM | POA: Diagnosis not present

## 2019-02-10 DIAGNOSIS — M7072 Other bursitis of hip, left hip: Secondary | ICD-10-CM | POA: Diagnosis not present

## 2019-02-10 DIAGNOSIS — H832X9 Labyrinthine dysfunction, unspecified ear: Secondary | ICD-10-CM | POA: Diagnosis not present

## 2019-02-10 DIAGNOSIS — M7130 Other bursal cyst, unspecified site: Secondary | ICD-10-CM | POA: Diagnosis not present

## 2019-02-13 DIAGNOSIS — M545 Low back pain: Secondary | ICD-10-CM | POA: Diagnosis not present

## 2019-02-13 DIAGNOSIS — M25552 Pain in left hip: Secondary | ICD-10-CM | POA: Diagnosis not present

## 2019-02-17 DIAGNOSIS — N3941 Urge incontinence: Secondary | ICD-10-CM | POA: Diagnosis not present

## 2019-02-27 DIAGNOSIS — M7062 Trochanteric bursitis, left hip: Secondary | ICD-10-CM | POA: Diagnosis not present

## 2019-03-07 ENCOUNTER — Ambulatory Visit: Payer: Medicare Other | Attending: Geriatric Medicine | Admitting: Physical Therapy

## 2019-03-07 ENCOUNTER — Other Ambulatory Visit: Payer: Self-pay

## 2019-03-07 DIAGNOSIS — R42 Dizziness and giddiness: Secondary | ICD-10-CM | POA: Insufficient documentation

## 2019-03-07 DIAGNOSIS — H353132 Nonexudative age-related macular degeneration, bilateral, intermediate dry stage: Secondary | ICD-10-CM | POA: Diagnosis not present

## 2019-03-07 DIAGNOSIS — R2681 Unsteadiness on feet: Secondary | ICD-10-CM | POA: Diagnosis not present

## 2019-03-08 ENCOUNTER — Encounter: Payer: Self-pay | Admitting: Physical Therapy

## 2019-03-08 NOTE — Patient Instructions (Signed)
Balance on foam - 4 positions  Sit to sidelying - 5 reps 2x/day

## 2019-03-08 NOTE — Therapy (Signed)
[facility] [address] [place], [place], [address] Phone: [phone]   Fax:  [phone]  Physical Therapy Evaluation  Patient Details  Name: Cassandra Morrison MRN: [id number] Date of Birth: Apr 24, 1941 Referring Provider (PT): Dr. Lajean Manes   Encounter Date: 03/07/2019  PT End of Session - 03/08/19 1756    Visit Number  1    Number of Visits  4    Date for PT Re-Evaluation  04/07/19    Authorization Type  Medicare/Medicaid    Authorization Time Period  03-07-19 - 06-05-19    PT Start Time  1303    PT Stop Time  0093    PT Time Calculation (min)  55 min    Activity Tolerance  Patient tolerated treatment well;Other (comment)   c/o mild nausea and some mild perspiration noted after eval   Behavior During Therapy  [facility] for tasks assessed/performed       Past Medical History:  Diagnosis Date  . Complication of anesthesia    nausea   . CPAP (continuous positive airway pressure) dependence   . Depression   . Diverticulitis   . GERD (gastroesophageal reflux disease)   . Hypercholesterolemia   . Nephrolithiasis   . OAB (overactive bladder)   . Obesity   . Osteopenia   . Sciatica   . Sleep apnea     Past Surgical History:  Procedure Laterality Date  . COLONOSCOPY WITH PROPOFOL N/A 05/20/2015   Procedure: COLONOSCOPY WITH PROPOFOL;  Surgeon: Garlan Fair, MD;  Location: [facility];  Service: Endoscopy;  Laterality: N/A;    There were no vitals filed for this visit.   Subjective Assessment - 03/07/19 1301    Subjective  Pt reports she has motion sickness - accompanied by nausea/vomiting when she has episode; walks a mile and a 1/2 every day but is unable to stop and talk (stand still) due to dizziness; pt denies room spinning vertigo; states it is difficult for her to climb stairs due to dizziness (but not all the time)    Pertinent History  OSA on CPAP;  SVT in 2015    Patient Stated Goals  "want the  dizziness to stop"; wants the motion sickness to stop    Currently in Pain?  No/denies         [facility] PT Assessment - 03/07/19 1307      Assessment   Medical Diagnosis  Vestibular dysfunction    Referring Provider (PT)  Dr. Lajean Manes    Onset Date/Surgical Date  --   pt reports progressive worsening in past 6 months   Prior Therapy  none      Precautions   Precautions  None      Balance Screen   Has the patient fallen in the past 6 months  No    Has the patient had a decrease in activity level because of a fear of falling?   No    Is the patient reluctant to leave their home because of a fear of falling?   No      Home Environment   Living Environment  Private residence    Type of [place] Layout  Two level    Alternate Level Stairs-Number of Steps  12    Alternate Level Stairs-Rails  Right      Prior Function   Level of Independence  Independent    Vocation  Retired    Leisure  pt  enjoys travelling           Vestibular Assessment - 03/08/19 0001      Vestibular Assessment   General Observation  pt is a 78 yr old lady amb. independently; reports she has motion sickness - wants to be able to ride in car as a passenger; states she is OK if she is the one driving      Symptom Behavior   Subjective history of current problem  pt reports she has had motion sickness since she was a child; states the dizziness problem now - associated with motion sickness - has gotten worse within past 6-8 months; pt states she was prescribed Scopoline for it but she was allergic to this medication    Type of Dizziness   Imbalance;Unsteady with head/body turns;"Funny feeling in head"    Frequency of Dizziness  varies depending on activity    Duration of Dizziness  minute to hours    Symptom Nature  Motion provoked    Aggravating Factors  Activity in general    Relieving Factors  Rest;Lying supine    Progression of Symptoms  Worse      Oculomotor Exam   Oculomotor  Alignment  Normal    Spontaneous  Absent    Head shaking Horizontal  Absent      Positional Testing   Dix-Hallpike  Dix-Hallpike Right;Dix-Hallpike Left    Sidelying Test  Sidelying Right;Sidelying Left      Dix-Hallpike Right   Dix-Hallpike Right Duration  none    Dix-Hallpike Right Symptoms  No nystagmus      Dix-Hallpike Left   Dix-Hallpike Left Duration  none    Dix-Hallpike Left Symptoms  No nystagmus      Sidelying Right   Sidelying Right Duration  none but some c/o dizziness with return to upright    Sidelying Right Symptoms  No nystagmus      Sidelying Left   Sidelying Left Duration  none - mild c/o dizziness with return to upright    Sidelying Left Symptoms  No nystagmus          Objective measurements completed on examination: See above findings.    Sensory Organization Test is WNL's - composite score 80/100 with N=62/100 Somatosensory, visual and vestibular inputs are all WNL's  DVA is 1 line difference (WNL's)   Pt instructed in balance on foam - eyes open/closed and feet apart/together for HEP And sit to sidelying for habituation       PT Education - 03/08/19 1754    Education Details  balance on foam; sit to sidelying 5x - 2-3x/day    Person(s) Educated  Patient    Methods  Explanation;Demonstration;Handout    Comprehension  Verbalized understanding;Returned demonstration          PT Long Term Goals - 03/08/19 1812      PT LONG TERM GOAL #1   Title  Pt will report at least 50% improvement in dizziness by reporting improved ability to stop and talk after amb. extended distance, i.e. 1/4 mile.    Time  4    Period  Weeks    Status  New    Target Date  04/07/19      PT LONG TERM GOAL #2   Title  Independent in HEP for vestibular exercises.    Time  4    Period  Weeks    Status  New    Target Date  04/07/19  Plan - 03/08/19 1757    Clinical Impression Statement  Pt's SOT score is WNL's, however, pt presents with  signs and symptoms of vestibular hypofunction including c/o nauseau, wooziness and clamminess of skin noted after testing and exercises.  Pt also tested WNL's with DVA but had mild sway in path with amb. with head turns.    Personal Factors and Comorbidities  Comorbidity 1;Time since onset of injury/illness/exacerbation    Comorbidities  OSA on CPAP, SVT in 2015, osteopenia    Examination-Activity Limitations  Locomotion Level;Stand;Stairs;Other   unable to ride as passenger in car due to motion sickness   Examination-Participation Restrictions  Community Activity;Driving;Interpersonal Relationship;Shop    Stability/Clinical Decision Making  Stable/Uncomplicated    Clinical Decision Making  Low    Rehab Potential  Good    PT Frequency  1x / week    PT Duration  4 weeks    PT Treatment/Interventions  ADLs/Self Care Home Management;Vestibular;Therapeutic activities;Therapeutic exercise;Balance training;Neuromuscular re-education;Gait training;Stair training    PT Next Visit Plan  check balance on foam exercises and sit to sidelying    PT Home Exercise Plan  balance on foam    Consulted and Agree with Plan of Care  Patient       Patient will benefit from skilled therapeutic intervention in order to improve the following deficits and impairments:  Dizziness, Decreased balance  Visit Diagnosis: 1. Dizziness and giddiness   2. Unsteadiness on feet        Problem List Patient Active Problem List   Diagnosis Date Noted  . Spider veins 05/29/2015  . Paroxysmal supraventricular tachycardia ([place]) 01/29/2014  . Chest tightness 01/29/2014  . REM sleep behavior disorder 06/09/2013  . OSA on CPAP 06/09/2013  . Obesity, unspecified 06/09/2013    Shaheen Star, [place], PT 03/08/2019, 6:23 PM  [facility] [address] [place], [place], [address] Phone: [phone]   Fax:  [phone]  Name: YARIMAR LAVIS MRN:  [id number] Date of Birth: 05-22-1941

## 2019-03-10 NOTE — Addendum Note (Signed)
Addended by: Lamar Benes on: 03/10/2019 06:33 PM   Modules accepted: Orders

## 2019-03-28 ENCOUNTER — Ambulatory Visit: Payer: Medicare Other | Admitting: Physical Therapy

## 2019-04-05 DIAGNOSIS — Z23 Encounter for immunization: Secondary | ICD-10-CM | POA: Diagnosis not present

## 2019-04-06 DIAGNOSIS — H353132 Nonexudative age-related macular degeneration, bilateral, intermediate dry stage: Secondary | ICD-10-CM | POA: Diagnosis not present

## 2019-04-18 ENCOUNTER — Ambulatory Visit: Payer: Medicare Other | Admitting: Physical Therapy

## 2019-05-06 DIAGNOSIS — H353132 Nonexudative age-related macular degeneration, bilateral, intermediate dry stage: Secondary | ICD-10-CM | POA: Diagnosis not present

## 2019-05-12 DIAGNOSIS — H0015 Chalazion left lower eyelid: Secondary | ICD-10-CM | POA: Diagnosis not present

## 2019-05-16 ENCOUNTER — Ambulatory Visit: Payer: Medicare Other | Attending: Geriatric Medicine | Admitting: Physical Therapy

## 2019-05-16 ENCOUNTER — Other Ambulatory Visit: Payer: Self-pay

## 2019-05-16 DIAGNOSIS — R2681 Unsteadiness on feet: Secondary | ICD-10-CM

## 2019-05-16 DIAGNOSIS — R42 Dizziness and giddiness: Secondary | ICD-10-CM | POA: Diagnosis not present

## 2019-05-16 NOTE — Patient Instructions (Signed)
SINGLE LIMB STANCE    Stance: single leg on floor. Raise leg. Hold _10-15__ seconds. Repeat with other leg. _2__ reps per set, __1_ sets per day, _3-5__ days per week   Tandem Stance    Right foot in front of left, heel touching toe both feet "straight ahead". Stand on Foot Triangle of Support with both feet. Balance in this position _30__ seconds. Do with left foot in front of right.  Also do walking heel to toe - along counter with UE support as needed - 2-3 times along counter    Feet Heel-Toe "Tandem" (Compliant Surface) Head Motion - Eyes OPEN- can do partial heel to toe    Stand on compliant surface: _pillow_______ with right/left foot directly in front of the other. Close eyes and move head slowly, up and down. Repeat 5-10_head turns  per session. Do _1___ sessions per day.

## 2019-05-17 ENCOUNTER — Encounter: Payer: Self-pay | Admitting: Physical Therapy

## 2019-05-17 NOTE — Therapy (Signed)
[facility] [address] [place], [place], [address] Phone: [phone]   Fax:  [phone]  Physical Therapy Treatment & Discharge Summary  Patient Details  Name: Cassandra Morrison MRN: [id number] Date of Birth: [birth year] Referring Provider (PT): Dr. Lajean Manes   Encounter Date: 05/16/2019  PT End of Session - 05/17/19 2103    Visit Number  2    Number of Visits  4    Date for PT Re-Evaluation  04/07/19    Authorization Type  Medicare/BCBS Federal    Authorization Time Period  03-07-19 - 06-05-19    PT Start Time  1702    PT Stop Time  1745    PT Time Calculation (min)  43 min    Activity Tolerance  Patient tolerated treatment well   c/o mild nausea and some mild perspiration noted after eval   Behavior During Therapy  [facility] for tasks assessed/performed       Past Medical History:  Diagnosis Date  . Complication of anesthesia    nausea   . CPAP (continuous positive airway pressure) dependence   . Depression   . Diverticulitis   . GERD (gastroesophageal reflux disease)   . Hypercholesterolemia   . Nephrolithiasis   . OAB (overactive bladder)   . Obesity   . Osteopenia   . Sciatica   . Sleep apnea     Past Surgical History:  Procedure Laterality Date  . COLONOSCOPY WITH PROPOFOL N/A 05/20/2015   Procedure: COLONOSCOPY WITH PROPOFOL;  Surgeon: Garlan Fair, MD;  Location: [facility];  Service: Endoscopy;  Laterality: N/A;    There were no vitals filed for this visit.  Subjective Assessment - 05/17/19 2058    Subjective  Pt reports she has been doing the exercises and is no longer having the problem of being unable to stand still and maintain balance after having ambulated prolonged distance;  pt does continue to report having motion sickness - unable to ride as passenger in car on [place] roads    Patient Stated Goals  "want the dizziness to stop"; wants the motion sickness to stop    Currently  in Pain?  No/denies       Self Care; Reviewed HEP, progress and LTG's with pt; pt feels she is doing well at this time - continues to c/o motion sickness at times Pt has brought print out of SOT - inquires about test results; reviewed/educated pt in her score (all WNL's) and explained test results to her Continued to discuss motion sensitivity - pt inquired about OTC medications (Dramamine, Bonine, etc.)   Added SLS and tandem stance to HEP with instruction to perform these exercises at counter for UE support prn for safety                       PT Education - 05/17/19 2101    Education Details  reviewed HEP - standing on compliant surfaces with head turns;  added SLS and tandem stance to HEP for balance    Person(s) Educated  Patient    Methods  Explanation;Demonstration;Handout    Comprehension  Verbalized understanding;Returned demonstration          PT Long Term Goals - 05/16/19 1714      PT LONG TERM GOAL #1   Title  Pt will report at least 50% improvement in dizziness by reporting improved ability to stop and talk after amb. extended distance, i.e. 1/4 mile.  Time  4    Period  Weeks    Status  Achieved      PT LONG TERM GOAL #2   Title  Independent in HEP for vestibular exercises.    Time  4    Period  Weeks    Status  Achieved            Plan - 05/17/19 2104    Clinical Impression Statement  Pt has met 2/2 LTG's; pt reports HEP (standing on pillow) has gotten easier, but pt states she continues to get motion sickness and is unable to tolerate riding in car as a passenger, especially if someone does not drive smoothly and hits the brakes frequently.  Pt states she is doing well overall - added high level balance exercises to HEP; D/C at this time due to goals achieved and pt is pleased with her current functional status.    Personal Factors and Comorbidities  Comorbidity 1;Time since onset of injury/illness/exacerbation    Comorbidities  OSA  on CPAP, SVT in 2015, osteopenia    Examination-Activity Limitations  Locomotion Level;Stand;Stairs;Other   unable to ride as passenger in car due to motion sickness   Examination-Participation Restrictions  Community Activity;Driving;Interpersonal Relationship;Shop    Stability/Clinical Decision Making  Stable/Uncomplicated    Rehab Potential  Good    PT Frequency  1x / week    PT Duration  4 weeks    PT Treatment/Interventions  ADLs/Self Care Home Management;Vestibular;Therapeutic activities;Therapeutic exercise;Balance training;Neuromuscular re-education;Gait training;Stair training    PT Next Visit Plan  D/C    PT Home Exercise Plan  balance on foam    Consulted and Agree with Plan of Care  Patient       Patient will benefit from skilled therapeutic intervention in order to improve the following deficits and impairments:  Dizziness, Decreased balance  Visit Diagnosis: Dizziness and giddiness  Unsteadiness on feet     Problem List Patient Active Problem List   Diagnosis Date Noted  . Spider veins 05/29/2015  . Paroxysmal supraventricular tachycardia ([place]) 01/29/2014  . Chest tightness 01/29/2014  . REM sleep behavior disorder 06/09/2013  . OSA on CPAP 06/09/2013  . Obesity, unspecified 06/09/2013   PHYSICAL THERAPY DISCHARGE SUMMARY  Visits from Start of Care: 2  Current functional level related to goals / functional outcomes: See above for progress towards goals - pt has met 2/2 LTG's  Pt's Sensory Organization Test (SOT) score is WNL's  Remaining deficits: Pt cont. To c/o motion sensitivity and has motion sickness with riding in car on [place] roads   Education / Equipment: Pt has been instructed in HEP consisting of balance/vestibular exercises. Plan: Patient agrees to discharge.  Patient goals were met. Patient is being discharged due to meeting the stated rehab goals.  ?????        [id number], GHWEX HBZJIRC, PT 05/17/2019, 9:10 PM  [facility] [address] [place], [place], [address] Phone: [phone]   Fax:  [phone]  Name: Cassandra Morrison MRN: [id number] Date of Birth: [birth year]

## 2019-06-02 DIAGNOSIS — H0015 Chalazion left lower eyelid: Secondary | ICD-10-CM | POA: Diagnosis not present

## 2019-06-05 DIAGNOSIS — H353132 Nonexudative age-related macular degeneration, bilateral, intermediate dry stage: Secondary | ICD-10-CM | POA: Diagnosis not present

## 2019-06-30 DIAGNOSIS — H52203 Unspecified astigmatism, bilateral: Secondary | ICD-10-CM | POA: Diagnosis not present

## 2019-06-30 DIAGNOSIS — H0015 Chalazion left lower eyelid: Secondary | ICD-10-CM | POA: Diagnosis not present

## 2019-06-30 DIAGNOSIS — H04123 Dry eye syndrome of bilateral lacrimal glands: Secondary | ICD-10-CM | POA: Diagnosis not present

## 2019-06-30 DIAGNOSIS — H353132 Nonexudative age-related macular degeneration, bilateral, intermediate dry stage: Secondary | ICD-10-CM | POA: Diagnosis not present

## 2019-07-05 DIAGNOSIS — H353132 Nonexudative age-related macular degeneration, bilateral, intermediate dry stage: Secondary | ICD-10-CM | POA: Diagnosis not present

## 2019-08-04 DIAGNOSIS — H353132 Nonexudative age-related macular degeneration, bilateral, intermediate dry stage: Secondary | ICD-10-CM | POA: Diagnosis not present

## 2019-08-16 ENCOUNTER — Ambulatory Visit: Payer: Medicare Other | Attending: Geriatric Medicine

## 2019-08-16 DIAGNOSIS — Z23 Encounter for immunization: Secondary | ICD-10-CM | POA: Diagnosis not present

## 2019-09-01 DIAGNOSIS — E7800 Pure hypercholesterolemia, unspecified: Secondary | ICD-10-CM | POA: Diagnosis not present

## 2019-09-01 DIAGNOSIS — R350 Frequency of micturition: Secondary | ICD-10-CM | POA: Diagnosis not present

## 2019-09-01 DIAGNOSIS — Z0000 Encounter for general adult medical examination without abnormal findings: Secondary | ICD-10-CM | POA: Diagnosis not present

## 2019-09-01 DIAGNOSIS — J3089 Other allergic rhinitis: Secondary | ICD-10-CM | POA: Diagnosis not present

## 2019-09-01 DIAGNOSIS — R0789 Other chest pain: Secondary | ICD-10-CM | POA: Diagnosis not present

## 2019-09-01 DIAGNOSIS — Z79899 Other long term (current) drug therapy: Secondary | ICD-10-CM | POA: Diagnosis not present

## 2019-09-01 DIAGNOSIS — Z1389 Encounter for screening for other disorder: Secondary | ICD-10-CM | POA: Diagnosis not present

## 2019-09-03 DIAGNOSIS — H353132 Nonexudative age-related macular degeneration, bilateral, intermediate dry stage: Secondary | ICD-10-CM | POA: Diagnosis not present

## 2019-09-06 ENCOUNTER — Ambulatory Visit: Payer: Medicare Other | Attending: Internal Medicine

## 2019-09-06 DIAGNOSIS — Z23 Encounter for immunization: Secondary | ICD-10-CM

## 2019-09-06 NOTE — Progress Notes (Signed)
   Covid-19 Vaccination Clinic  Name:  Cassandra Morrison    MRN: [id number] DOB: 05-01-1941  09/06/2019  Cassandra Morrison was observed post Covid-19 immunization for 15 minutes without incidence. She was provided with Vaccine Information Sheet and instruction to access the V-Safe system.   Cassandra Morrison was instructed to call 911 with any severe reactions post vaccine: Marland Kitchen Difficulty breathing  . Swelling of your face and throat  . A fast heartbeat  . A bad rash all over your body  . Dizziness and weakness    Immunizations Administered    Name Date Dose VIS Date Route   Pfizer COVID-19 Vaccine 09/06/2019  9:56 AM 0.3 mL 07/07/2019 Intramuscular   Manufacturer: [place]   Lot: [id number]   [place]: [id number]

## 2019-09-28 DIAGNOSIS — Z23 Encounter for immunization: Secondary | ICD-10-CM | POA: Diagnosis not present

## 2019-10-03 DIAGNOSIS — H353132 Nonexudative age-related macular degeneration, bilateral, intermediate dry stage: Secondary | ICD-10-CM | POA: Diagnosis not present

## 2019-11-02 DIAGNOSIS — H353132 Nonexudative age-related macular degeneration, bilateral, intermediate dry stage: Secondary | ICD-10-CM | POA: Diagnosis not present

## 2019-11-29 DIAGNOSIS — L821 Other seborrheic keratosis: Secondary | ICD-10-CM | POA: Diagnosis not present

## 2019-11-29 DIAGNOSIS — L720 Epidermal cyst: Secondary | ICD-10-CM | POA: Diagnosis not present

## 2019-11-29 DIAGNOSIS — D2272 Melanocytic nevi of left lower limb, including hip: Secondary | ICD-10-CM | POA: Diagnosis not present

## 2019-11-29 DIAGNOSIS — D225 Melanocytic nevi of trunk: Secondary | ICD-10-CM | POA: Diagnosis not present

## 2019-11-29 DIAGNOSIS — D1801 Hemangioma of skin and subcutaneous tissue: Secondary | ICD-10-CM | POA: Diagnosis not present

## 2019-12-02 DIAGNOSIS — H353132 Nonexudative age-related macular degeneration, bilateral, intermediate dry stage: Secondary | ICD-10-CM | POA: Diagnosis not present

## 2019-12-27 DIAGNOSIS — Z1231 Encounter for screening mammogram for malignant neoplasm of breast: Secondary | ICD-10-CM | POA: Diagnosis not present

## 2020-01-01 DIAGNOSIS — H353132 Nonexudative age-related macular degeneration, bilateral, intermediate dry stage: Secondary | ICD-10-CM | POA: Diagnosis not present

## 2020-01-04 DIAGNOSIS — Z23 Encounter for immunization: Secondary | ICD-10-CM | POA: Diagnosis not present

## 2020-01-11 DIAGNOSIS — H353132 Nonexudative age-related macular degeneration, bilateral, intermediate dry stage: Secondary | ICD-10-CM | POA: Diagnosis not present

## 2020-01-11 DIAGNOSIS — H43813 Vitreous degeneration, bilateral: Secondary | ICD-10-CM | POA: Diagnosis not present

## 2020-01-11 DIAGNOSIS — H52203 Unspecified astigmatism, bilateral: Secondary | ICD-10-CM | POA: Diagnosis not present

## 2020-01-11 DIAGNOSIS — H04123 Dry eye syndrome of bilateral lacrimal glands: Secondary | ICD-10-CM | POA: Diagnosis not present

## 2020-02-19 DIAGNOSIS — H0100B Unspecified blepharitis left eye, upper and lower eyelids: Secondary | ICD-10-CM | POA: Diagnosis not present

## 2020-02-19 DIAGNOSIS — H02055 Trichiasis without entropian left lower eyelid: Secondary | ICD-10-CM | POA: Diagnosis not present

## 2020-03-01 DIAGNOSIS — H353132 Nonexudative age-related macular degeneration, bilateral, intermediate dry stage: Secondary | ICD-10-CM | POA: Diagnosis not present

## 2020-03-31 DIAGNOSIS — H353132 Nonexudative age-related macular degeneration, bilateral, intermediate dry stage: Secondary | ICD-10-CM | POA: Diagnosis not present

## 2020-04-05 DIAGNOSIS — L84 Corns and callosities: Secondary | ICD-10-CM | POA: Diagnosis not present

## 2020-04-05 DIAGNOSIS — M2041 Other hammer toe(s) (acquired), right foot: Secondary | ICD-10-CM | POA: Diagnosis not present

## 2020-04-05 DIAGNOSIS — M79671 Pain in right foot: Secondary | ICD-10-CM | POA: Diagnosis not present

## 2020-04-05 DIAGNOSIS — B070 Plantar wart: Secondary | ICD-10-CM | POA: Diagnosis not present

## 2020-04-18 DIAGNOSIS — Z23 Encounter for immunization: Secondary | ICD-10-CM | POA: Diagnosis not present

## 2020-04-30 DIAGNOSIS — H353132 Nonexudative age-related macular degeneration, bilateral, intermediate dry stage: Secondary | ICD-10-CM | POA: Diagnosis not present

## 2020-05-06 DIAGNOSIS — Z23 Encounter for immunization: Secondary | ICD-10-CM | POA: Diagnosis not present

## 2020-05-13 DIAGNOSIS — H02835 Dermatochalasis of left lower eyelid: Secondary | ICD-10-CM | POA: Diagnosis not present

## 2020-05-13 DIAGNOSIS — H02834 Dermatochalasis of left upper eyelid: Secondary | ICD-10-CM | POA: Diagnosis not present

## 2020-05-13 DIAGNOSIS — H02832 Dermatochalasis of right lower eyelid: Secondary | ICD-10-CM | POA: Diagnosis not present

## 2020-05-13 DIAGNOSIS — H02831 Dermatochalasis of right upper eyelid: Secondary | ICD-10-CM | POA: Diagnosis not present

## 2020-05-13 DIAGNOSIS — D485 Neoplasm of uncertain behavior of skin: Secondary | ICD-10-CM | POA: Diagnosis not present

## 2020-05-30 DIAGNOSIS — H353132 Nonexudative age-related macular degeneration, bilateral, intermediate dry stage: Secondary | ICD-10-CM | POA: Diagnosis not present

## 2020-06-29 DIAGNOSIS — H353132 Nonexudative age-related macular degeneration, bilateral, intermediate dry stage: Secondary | ICD-10-CM | POA: Diagnosis not present

## 2020-07-29 DIAGNOSIS — H353132 Nonexudative age-related macular degeneration, bilateral, intermediate dry stage: Secondary | ICD-10-CM | POA: Diagnosis not present

## 2020-08-01 DIAGNOSIS — H52201 Unspecified astigmatism, right eye: Secondary | ICD-10-CM | POA: Diagnosis not present

## 2020-08-01 DIAGNOSIS — H04123 Dry eye syndrome of bilateral lacrimal glands: Secondary | ICD-10-CM | POA: Diagnosis not present

## 2020-08-01 DIAGNOSIS — H353132 Nonexudative age-related macular degeneration, bilateral, intermediate dry stage: Secondary | ICD-10-CM | POA: Diagnosis not present

## 2020-08-01 DIAGNOSIS — H43813 Vitreous degeneration, bilateral: Secondary | ICD-10-CM | POA: Diagnosis not present

## 2020-08-28 DIAGNOSIS — H353132 Nonexudative age-related macular degeneration, bilateral, intermediate dry stage: Secondary | ICD-10-CM | POA: Diagnosis not present

## 2020-09-03 DIAGNOSIS — Z0000 Encounter for general adult medical examination without abnormal findings: Secondary | ICD-10-CM | POA: Diagnosis not present

## 2020-09-03 DIAGNOSIS — Z79899 Other long term (current) drug therapy: Secondary | ICD-10-CM | POA: Diagnosis not present

## 2020-09-03 DIAGNOSIS — R8290 Unspecified abnormal findings in urine: Secondary | ICD-10-CM | POA: Diagnosis not present

## 2020-09-03 DIAGNOSIS — N3281 Overactive bladder: Secondary | ICD-10-CM | POA: Diagnosis not present

## 2020-09-03 DIAGNOSIS — F325 Major depressive disorder, single episode, in full remission: Secondary | ICD-10-CM | POA: Diagnosis not present

## 2020-09-03 DIAGNOSIS — Z1389 Encounter for screening for other disorder: Secondary | ICD-10-CM | POA: Diagnosis not present

## 2020-09-17 DIAGNOSIS — F419 Anxiety disorder, unspecified: Secondary | ICD-10-CM | POA: Diagnosis not present

## 2020-09-17 DIAGNOSIS — H0231 Blepharochalasis right upper eyelid: Secondary | ICD-10-CM | POA: Diagnosis not present

## 2020-09-17 DIAGNOSIS — H02834 Dermatochalasis of left upper eyelid: Secondary | ICD-10-CM | POA: Diagnosis not present

## 2020-09-17 DIAGNOSIS — G4730 Sleep apnea, unspecified: Secondary | ICD-10-CM | POA: Diagnosis not present

## 2020-09-17 DIAGNOSIS — L720 Epidermal cyst: Secondary | ICD-10-CM | POA: Diagnosis not present

## 2020-09-17 DIAGNOSIS — H0234 Blepharochalasis left upper eyelid: Secondary | ICD-10-CM | POA: Diagnosis not present

## 2020-09-17 DIAGNOSIS — H02832 Dermatochalasis of right lower eyelid: Secondary | ICD-10-CM | POA: Diagnosis not present

## 2020-09-17 DIAGNOSIS — Z87891 Personal history of nicotine dependence: Secondary | ICD-10-CM | POA: Diagnosis not present

## 2020-09-17 DIAGNOSIS — H57812 Brow ptosis, left: Secondary | ICD-10-CM | POA: Diagnosis not present

## 2020-09-17 DIAGNOSIS — L988 Other specified disorders of the skin and subcutaneous tissue: Secondary | ICD-10-CM | POA: Diagnosis not present

## 2020-09-17 DIAGNOSIS — Z411 Encounter for cosmetic surgery: Secondary | ICD-10-CM | POA: Diagnosis not present

## 2020-09-17 DIAGNOSIS — H02835 Dermatochalasis of left lower eyelid: Secondary | ICD-10-CM | POA: Diagnosis not present

## 2020-09-17 DIAGNOSIS — D485 Neoplasm of uncertain behavior of skin: Secondary | ICD-10-CM | POA: Diagnosis not present

## 2020-09-17 DIAGNOSIS — H02831 Dermatochalasis of right upper eyelid: Secondary | ICD-10-CM | POA: Diagnosis not present

## 2020-09-27 DIAGNOSIS — H353132 Nonexudative age-related macular degeneration, bilateral, intermediate dry stage: Secondary | ICD-10-CM | POA: Diagnosis not present

## 2020-10-03 DIAGNOSIS — Z79899 Other long term (current) drug therapy: Secondary | ICD-10-CM | POA: Diagnosis not present

## 2020-10-03 DIAGNOSIS — M2041 Other hammer toe(s) (acquired), right foot: Secondary | ICD-10-CM | POA: Diagnosis not present

## 2020-10-03 DIAGNOSIS — L84 Corns and callosities: Secondary | ICD-10-CM | POA: Diagnosis not present

## 2020-10-27 DIAGNOSIS — H353132 Nonexudative age-related macular degeneration, bilateral, intermediate dry stage: Secondary | ICD-10-CM | POA: Diagnosis not present

## 2020-11-01 DIAGNOSIS — N3941 Urge incontinence: Secondary | ICD-10-CM | POA: Diagnosis not present

## 2020-11-01 DIAGNOSIS — R151 Fecal smearing: Secondary | ICD-10-CM | POA: Diagnosis not present

## 2020-11-26 DIAGNOSIS — H353132 Nonexudative age-related macular degeneration, bilateral, intermediate dry stage: Secondary | ICD-10-CM | POA: Diagnosis not present

## 2020-11-27 DIAGNOSIS — H353112 Nonexudative age-related macular degeneration, right eye, intermediate dry stage: Secondary | ICD-10-CM | POA: Diagnosis not present

## 2020-11-27 DIAGNOSIS — H5315 Visual distortions of shape and size: Secondary | ICD-10-CM | POA: Diagnosis not present

## 2020-11-27 DIAGNOSIS — H5310 Unspecified subjective visual disturbances: Secondary | ICD-10-CM | POA: Diagnosis not present

## 2020-11-27 DIAGNOSIS — Z961 Presence of intraocular lens: Secondary | ICD-10-CM | POA: Diagnosis not present

## 2020-11-28 DIAGNOSIS — L821 Other seborrheic keratosis: Secondary | ICD-10-CM | POA: Diagnosis not present

## 2020-11-28 DIAGNOSIS — D2239 Melanocytic nevi of other parts of face: Secondary | ICD-10-CM | POA: Diagnosis not present

## 2020-11-28 DIAGNOSIS — D1801 Hemangioma of skin and subcutaneous tissue: Secondary | ICD-10-CM | POA: Diagnosis not present

## 2020-11-28 DIAGNOSIS — L570 Actinic keratosis: Secondary | ICD-10-CM | POA: Diagnosis not present

## 2020-11-28 DIAGNOSIS — L720 Epidermal cyst: Secondary | ICD-10-CM | POA: Diagnosis not present

## 2020-11-28 DIAGNOSIS — D225 Melanocytic nevi of trunk: Secondary | ICD-10-CM | POA: Diagnosis not present

## 2020-12-04 DIAGNOSIS — Z23 Encounter for immunization: Secondary | ICD-10-CM | POA: Diagnosis not present

## 2020-12-16 DIAGNOSIS — L84 Corns and callosities: Secondary | ICD-10-CM | POA: Diagnosis not present

## 2020-12-16 DIAGNOSIS — M79672 Pain in left foot: Secondary | ICD-10-CM | POA: Diagnosis not present

## 2020-12-24 DIAGNOSIS — N3941 Urge incontinence: Secondary | ICD-10-CM | POA: Diagnosis not present

## 2020-12-26 DIAGNOSIS — H353132 Nonexudative age-related macular degeneration, bilateral, intermediate dry stage: Secondary | ICD-10-CM | POA: Diagnosis not present

## 2020-12-26 DIAGNOSIS — Z1231 Encounter for screening mammogram for malignant neoplasm of breast: Secondary | ICD-10-CM | POA: Diagnosis not present

## 2021-01-06 DIAGNOSIS — D485 Neoplasm of uncertain behavior of skin: Secondary | ICD-10-CM | POA: Diagnosis not present

## 2021-01-25 DIAGNOSIS — H353132 Nonexudative age-related macular degeneration, bilateral, intermediate dry stage: Secondary | ICD-10-CM | POA: Diagnosis not present

## 2021-02-03 DIAGNOSIS — D3132 Benign neoplasm of left choroid: Secondary | ICD-10-CM | POA: Diagnosis not present

## 2021-02-03 DIAGNOSIS — Z961 Presence of intraocular lens: Secondary | ICD-10-CM | POA: Diagnosis not present

## 2021-02-03 DIAGNOSIS — H52203 Unspecified astigmatism, bilateral: Secondary | ICD-10-CM | POA: Diagnosis not present

## 2021-02-03 DIAGNOSIS — H353132 Nonexudative age-related macular degeneration, bilateral, intermediate dry stage: Secondary | ICD-10-CM | POA: Diagnosis not present

## 2021-02-05 DIAGNOSIS — L84 Corns and callosities: Secondary | ICD-10-CM | POA: Diagnosis not present

## 2021-02-05 DIAGNOSIS — M2041 Other hammer toe(s) (acquired), right foot: Secondary | ICD-10-CM | POA: Diagnosis not present

## 2021-02-07 DIAGNOSIS — N3941 Urge incontinence: Secondary | ICD-10-CM | POA: Diagnosis not present

## 2021-02-24 DIAGNOSIS — H353132 Nonexudative age-related macular degeneration, bilateral, intermediate dry stage: Secondary | ICD-10-CM | POA: Diagnosis not present

## 2021-02-26 DIAGNOSIS — L988 Other specified disorders of the skin and subcutaneous tissue: Secondary | ICD-10-CM | POA: Diagnosis not present

## 2021-03-26 DIAGNOSIS — H353132 Nonexudative age-related macular degeneration, bilateral, intermediate dry stage: Secondary | ICD-10-CM | POA: Diagnosis not present

## 2021-04-08 DIAGNOSIS — Z23 Encounter for immunization: Secondary | ICD-10-CM | POA: Diagnosis not present

## 2021-04-11 DIAGNOSIS — L84 Corns and callosities: Secondary | ICD-10-CM | POA: Diagnosis not present

## 2021-04-11 DIAGNOSIS — M2041 Other hammer toe(s) (acquired), right foot: Secondary | ICD-10-CM | POA: Diagnosis not present

## 2021-04-24 DIAGNOSIS — Z23 Encounter for immunization: Secondary | ICD-10-CM | POA: Diagnosis not present

## 2021-04-25 DIAGNOSIS — H353132 Nonexudative age-related macular degeneration, bilateral, intermediate dry stage: Secondary | ICD-10-CM | POA: Diagnosis not present

## 2021-06-02 DIAGNOSIS — L84 Corns and callosities: Secondary | ICD-10-CM | POA: Diagnosis not present

## 2021-06-02 DIAGNOSIS — M2041 Other hammer toe(s) (acquired), right foot: Secondary | ICD-10-CM | POA: Diagnosis not present

## 2021-06-24 DIAGNOSIS — H353132 Nonexudative age-related macular degeneration, bilateral, intermediate dry stage: Secondary | ICD-10-CM | POA: Diagnosis not present

## 2021-07-07 DIAGNOSIS — L84 Corns and callosities: Secondary | ICD-10-CM | POA: Diagnosis not present

## 2021-07-07 DIAGNOSIS — M2041 Other hammer toe(s) (acquired), right foot: Secondary | ICD-10-CM | POA: Diagnosis not present

## 2021-07-24 DIAGNOSIS — H353132 Nonexudative age-related macular degeneration, bilateral, intermediate dry stage: Secondary | ICD-10-CM | POA: Diagnosis not present

## 2021-08-13 DIAGNOSIS — M2041 Other hammer toe(s) (acquired), right foot: Secondary | ICD-10-CM | POA: Diagnosis not present

## 2021-08-13 DIAGNOSIS — L84 Corns and callosities: Secondary | ICD-10-CM | POA: Diagnosis not present

## 2021-08-20 DIAGNOSIS — G250 Essential tremor: Secondary | ICD-10-CM | POA: Diagnosis not present

## 2021-08-20 DIAGNOSIS — M542 Cervicalgia: Secondary | ICD-10-CM | POA: Diagnosis not present

## 2021-08-23 DIAGNOSIS — H353132 Nonexudative age-related macular degeneration, bilateral, intermediate dry stage: Secondary | ICD-10-CM | POA: Diagnosis not present

## 2021-09-05 ENCOUNTER — Ambulatory Visit
Admission: RE | Admit: 2021-09-05 | Discharge: 2021-09-05 | Disposition: A | Discharge status: Home / Self Care | Payer: Medicare Other | Source: Ambulatory Visit | Attending: Geriatric Medicine | Admitting: Geriatric Medicine

## 2021-09-05 ENCOUNTER — Other Ambulatory Visit: Payer: Self-pay | Admitting: Geriatric Medicine

## 2021-09-05 DIAGNOSIS — M542 Cervicalgia: Secondary | ICD-10-CM | POA: Diagnosis not present

## 2021-09-10 DIAGNOSIS — R430 Anosmia: Secondary | ICD-10-CM | POA: Diagnosis not present

## 2021-09-10 DIAGNOSIS — Z79899 Other long term (current) drug therapy: Secondary | ICD-10-CM | POA: Diagnosis not present

## 2021-09-10 DIAGNOSIS — M25572 Pain in left ankle and joints of left foot: Secondary | ICD-10-CM | POA: Diagnosis not present

## 2021-09-10 DIAGNOSIS — L84 Corns and callosities: Secondary | ICD-10-CM | POA: Diagnosis not present

## 2021-09-10 DIAGNOSIS — Z0000 Encounter for general adult medical examination without abnormal findings: Secondary | ICD-10-CM | POA: Diagnosis not present

## 2021-09-10 DIAGNOSIS — K219 Gastro-esophageal reflux disease without esophagitis: Secondary | ICD-10-CM | POA: Diagnosis not present

## 2021-09-10 DIAGNOSIS — G250 Essential tremor: Secondary | ICD-10-CM | POA: Diagnosis not present

## 2021-09-10 DIAGNOSIS — F325 Major depressive disorder, single episode, in full remission: Secondary | ICD-10-CM | POA: Diagnosis not present

## 2021-09-10 DIAGNOSIS — Z136 Encounter for screening for cardiovascular disorders: Secondary | ICD-10-CM | POA: Diagnosis not present

## 2021-09-10 DIAGNOSIS — S93402A Sprain of unspecified ligament of left ankle, initial encounter: Secondary | ICD-10-CM | POA: Diagnosis not present

## 2021-09-10 DIAGNOSIS — Z7189 Other specified counseling: Secondary | ICD-10-CM | POA: Diagnosis not present

## 2021-09-17 NOTE — Therapy (Signed)
OUTPATIENT PHYSICAL THERAPY CERVICAL EVALUATION   Patient Name: Cassandra Morrison MRN: [id number] DOB:1941-05-04, 81 y.o., female Today's Date: 09/18/2021   PT End of Session - 09/18/21 1258     Visit Number 1    Number of Visits 9    Date for PT Re-Evaluation 11/13/21    Authorization Type MCR    Authorization Time Period FOTO v6, v10    Progress Note Due on Visit 10    PT Start Time 1233   Pt arrived 18 minutes late to her eval.   PT Stop Time 1300    PT Time Calculation (min) 27 min    Activity Tolerance Patient tolerated treatment well    Behavior During Therapy WFL for tasks assessed/performed             Past Medical History:  Diagnosis Date   Complication of anesthesia    nausea    CPAP (continuous positive airway pressure) dependence    Depression    Diverticulitis    GERD (gastroesophageal reflux disease)    Hypercholesterolemia    Nephrolithiasis    OAB (overactive bladder)    Obesity    Osteopenia    Sciatica    Sleep apnea    Past Surgical History:  Procedure Laterality Date   COLONOSCOPY WITH PROPOFOL N/A 05/20/2015   Procedure: COLONOSCOPY WITH PROPOFOL;  Surgeon: Garlan Fair, MD;  Location: [facility];  Service: Endoscopy;  Laterality: N/A;   Patient Active Problem List   Diagnosis Date Noted   Spider veins 05/29/2015   Paroxysmal supraventricular tachycardia ([place]) 01/29/2014   Chest tightness 01/29/2014   REM sleep behavior disorder 06/09/2013   OSA on CPAP 06/09/2013   Obesity, unspecified 06/09/2013    PCP: Lajean Manes, MD  REFERRING PROVIDER: Lajean Manes, MD  REFERRING DIAG: M54.2 (ICD-10-CM) - Cervicalgia  THERAPY DIAG:  Neck pain  Muscle weakness (generalized)  ONSET DATE: 1 month ago  SUBJECTIVE:                                                                                                                                                                                                         SUBJECTIVE  STATEMENT: Pt reports acute neck pain of insidious onset lasting about 1 month. She reports that she notices it the most when turning her head when driving or when wearing a scarf. She reports that the pain can travel up each side of her neck to the base of her skull. Pt denies any N/T or radicular pain related to this problem. She also reports that  the pain has gotten better since taking pain medication that she was given for an acute ankle sprain a couple of weeks back. Current pain is 3-4/10. Worst pain is 7-8/10. Best pain is 0/10. Red flag screening negative.  PERTINENT HISTORY:  osteopenia  PAIN:  Are you having pain? Yes NPRS scale: 4/10 Pain location: Posterior neck PAIN TYPE: aching Pain description: intermittent  Aggravating factors: Turning head, wearing scarf Relieving factors: pain medication  PRECAUTIONS: None  WEIGHT BEARING RESTRICTIONS No  FALLS:  Has patient fallen in last 6 months? Yes Number of falls: 1 time a couple months ago while reading and walking, no injuries  LIVING ENVIRONMENT: Lives with: lives with their family Lives in: House/apartment Stairs: Yes; Internal: 15 steps; on right going up and External: 4 steps; on right going up Has following equipment at home: None  OCCUPATION: Retired  PLOF: [place] Driving, wearing scarfs  OBJECTIVE:   DIAGNOSTIC FINDINGS:  09/05/2021: DG Cervical Spine Complete: IMPRESSION: 1. No acute/traumatic cervical spine pathology. 2. Degenerative changes.  PATIENT SURVEYS:  FOTO Administer treatment 1   COGNITION: Overall cognitive status: Within functional limits for tasks assessed   SENSATION: Light touch: Appears intact  POSTURE:  Forward head with rounded shoulders  PALPATION: TTP to BIL cervical paraspinals and mid/ upper traps   PASSIVE ACCESSORIES: Hypomobile and painful CPAs C5-T7  CERVICAL AROM  AROM AROM (deg) 09/18/2021  Flexion 70  Extension 70  Right lateral  flexion 30, stretch in Lt upper trap  Left lateral flexion 40  Right rotation 60p!  Left rotation 60p!   (Blank rows = not tested)   UE MMT:  MMT Right 09/18/2021 Left 09/18/2021  Shoulder flexion 5/5 5/5  Shoulder abduction 5/5 5/5  Middle trapezius 4/5 4/5  Lower trapezius 3/5 3/5  Latissimus dorsi 4/5 4/5  Elbow flexion 5/5 5/5  Elbow extension 5/5 5/5  Wrist flexion 5/5 5/5  Wrist extension 5/5 5/5  Grip strength     (Blank rows = not tested)  CERVICAL SPECIAL TESTS:  Cervical compression: (+) Cervical distraction: (+) Spurling's: (-)    TODAY'S TREATMENT:  09/18/2021: Demonstrated and issued HEP   PATIENT EDUCATION:  Education details: Pt educated on probable underlying pathophysiology behind her pain presentation, POC, prognosis, and HEP Person educated: Patient Education method: Explanation, Demonstration, and Handouts Education comprehension: verbalized understanding and returned demonstration   HOME EXERCISE PROGRAM: Access Code: [email or web address] URL: [email or web address] Date: 09/18/2021 Prepared by: Vanessa [place]  Exercises Seated Cervical Retraction - 1 x daily - 7 x weekly - 3 sets - 10 reps Standing Shoulder Row with Anchored Resistance - 1 x daily - 7 x weekly - 3 sets - 10 reps Seated Upper Trapezius Stretch - 1 x daily - 7 x weekly - 2 sets - 1-min hold Seated Levator Scapulae Stretch - 1 x daily - 7 x weekly - 2 sets - 1-min hold   ASSESSMENT:  CLINICAL IMPRESSION: Patient is an 81 y.o. F who was seen today for physical therapy evaluation and treatment for acute neck pain. Due to the pt arriving 18 minutes late to her eval, the treatment session was truncated today. Upon assessment, the pt's primary impairments include hypomobile and painful cervical and thoracic passive accessories, TTP to BIL cervical paraspinals/ parascapular muscles, weak global BIL parascapular musculature, limited BIL cervical side bend AROM Rt>Lt, limited and  painful BIL cervical rotation AROM, and tight BIL upper traps and levator scapulae. Ruling up cervical facet dysfunction due to pain with  cervical rotation/ side bending, positive cervical compression and distraction special testing, and hypomobile and painful cervicothoracic passive accessories. The pt will benefit from skilled PT to address her primary impairments and return to her prior level of function with less limitation.   OBJECTIVE IMPAIRMENTS decreased mobility, decreased ROM, decreased strength, hypomobility, impaired flexibility, postural dysfunction, and pain.   ACTIVITY LIMITATIONS community activity, driving, and shopping.   PERSONAL FACTORS 1-2 comorbidities: osteopenia  are also affecting patient's functional outcome.    REHAB POTENTIAL: Good  CLINICAL DECISION MAKING: Stable/uncomplicated  EVALUATION COMPLEXITY: Low   GOALS: Goals reviewed with patient? Yes  SHORT TERM GOALS:  STG Name Target Date Goal status  1 The pt will report understanding and adherence to her HEP in order to promote independence in the management of her primary impairments. Baseline: HEP provided at eval 10/16/2021 INITIAL   LONG TERM GOALS:   LTG Name Target Date Goal status  1 Pt will achieve BIL cervical rotation AROM of 70 degrees with 0-2/10 pain in order to drive without limitation. Baseline: 60 degrees BIL with 6/10 pain 11/13/2021 INITIAL  2 Pt will achieve a FOTO score of ___ in order to demonstrate improved functional ability as it relates to her primary impairments. Baseline: Administer at treatment 1 11/13/2021 INITIAL  3 Pt will achieve BIL global parascapular strength of 4+/5 in order to promote long-term postural improvement in the prophylactic care of future mechanical neck pain. Baseline: See MMT chart 11/13/2021 INITIAL  4 Pt will report ability to wear scarfs with 0/10 pain in order to dress for the cold weather. Baseline: Unable to wear scarfs >30 minutes due to increased  neck pain 11/13/2021 INITIAL   PLAN: PT FREQUENCY: 1x/week  PT DURATION: 8 weeks  PLANNED INTERVENTIONS: Therapeutic exercises, Therapeutic activity, Neuro Muscular re-education, Patient/Family education, Joint mobilization, Dry Needling, Spinal mobilization, Cryotherapy, Moist heat, Taping, Traction, and Manual therapy  PLAN FOR NEXT SESSION: Administer FOTO, assess response to HEP, progress DNF/ parascapular strengthening/ mobility, manual techniques for pain modulation   Vanessa [place], PT, DPT 09/18/21 1:27 PM

## 2021-09-18 ENCOUNTER — Other Ambulatory Visit: Payer: Self-pay

## 2021-09-18 ENCOUNTER — Ambulatory Visit: Payer: Medicare Other | Attending: Geriatric Medicine

## 2021-09-18 DIAGNOSIS — M542 Cervicalgia: Secondary | ICD-10-CM | POA: Insufficient documentation

## 2021-09-18 DIAGNOSIS — M6281 Muscle weakness (generalized): Secondary | ICD-10-CM | POA: Insufficient documentation

## 2021-09-22 DIAGNOSIS — H353132 Nonexudative age-related macular degeneration, bilateral, intermediate dry stage: Secondary | ICD-10-CM | POA: Diagnosis not present

## 2021-10-01 ENCOUNTER — Other Ambulatory Visit: Payer: Self-pay

## 2021-10-01 ENCOUNTER — Ambulatory Visit: Payer: Medicare Other | Attending: Geriatric Medicine | Admitting: Physical Therapy

## 2021-10-01 ENCOUNTER — Encounter: Payer: Self-pay | Admitting: Physical Therapy

## 2021-10-01 DIAGNOSIS — M6281 Muscle weakness (generalized): Secondary | ICD-10-CM | POA: Insufficient documentation

## 2021-10-01 DIAGNOSIS — M542 Cervicalgia: Secondary | ICD-10-CM | POA: Diagnosis not present

## 2021-10-01 DIAGNOSIS — S93402A Sprain of unspecified ligament of left ankle, initial encounter: Secondary | ICD-10-CM | POA: Diagnosis not present

## 2021-10-01 DIAGNOSIS — L84 Corns and callosities: Secondary | ICD-10-CM | POA: Diagnosis not present

## 2021-10-01 NOTE — Therapy (Signed)
?OUTPATIENT PHYSICAL THERAPY TREATMENT NOTE ? ? ?Patient Name: Cassandra Morrison ?MRN: [id number] ?DOB:[birth year], 81 y.o., female ?Today's Date: 10/01/2021 ? ?PCP: Lajean Manes, MD ?REFERRING PROVIDER: Lajean Manes, MD ? ? PT End of Session - 10/01/21 7829   ? ? Visit Number 2   ? Number of Visits 9   ? Date for PT Re-Evaluation 11/13/21   ? Authorization Type MCR   ? Authorization Time Period FOTO v6, v10   ? Progress Note Due on Visit 10   ? PT Start Time [phone]   ? PT Stop Time 1002   ? PT Time Calculation (min) 38 min   ? Activity Tolerance Patient tolerated treatment well   ? Behavior During Therapy [facility] for tasks assessed/performed   ? ?  ?  ? ?  ? ? ?Past Medical History:  ?Diagnosis Date  ? Complication of anesthesia   ? nausea   ? CPAP (continuous positive airway pressure) dependence   ? Depression   ? Diverticulitis   ? GERD (gastroesophageal reflux disease)   ? Hypercholesterolemia   ? Nephrolithiasis   ? OAB (overactive bladder)   ? Obesity   ? Osteopenia   ? Sciatica   ? Sleep apnea   ? ?Past Surgical History:  ?Procedure Laterality Date  ? COLONOSCOPY WITH PROPOFOL N/A 05/20/2015  ? Procedure: COLONOSCOPY WITH PROPOFOL;  Surgeon: Garlan Fair, MD;  Location: [facility];  Service: Endoscopy;  Laterality: N/A;  ? ?Patient Active Problem List  ? Diagnosis Date Noted  ? Spider veins 05/29/2015  ? Paroxysmal supraventricular tachycardia ([place]) 01/29/2014  ? Chest tightness 01/29/2014  ? 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 OSA on CPAP 06/09/2013  ? Obesity, unspecified 06/09/2013  ? ? ?REFERRING DIAG: M54.2 (ICD-10-CM) - Cervicalgia ? ?THERAPY DIAG:  ?Neck pain ? ?Muscle weakness (generalized) ? ?PERTINENT HISTORY: osteopenia ? ?PRECAUTIONS/RESTRICTIONS:  ? ?none ? ?SUBJECTIVE:  ?Pt reports that she has been busy and has been able to complete her exercises regularly.  She reports that the medicine she is taking for her ankle has been helping ? ?PAIN:  ?Are you having pain? Yes ?NPRS scale:  2/10 ?Pain location: Posterior neck ?PAIN TYPE: aching ?Pain description: intermittent  ?Aggravating factors: Turning head, wearing scarf ?Relieving factors: pain medication ? ?OBJECTIVE: ? ?CERVICAL AROM ?  ?AROM AROM (deg) ?09/18/2021  ?Flexion 70  ?Extension 70  ?Right lateral flexion 30, stretch in Lt upper trap  ?Left lateral flexion 40  ?Right rotation 60p!  ?Left rotation 60p!  ? (Blank rows = not tested) ?  ?  ?UE MMT: ?  ?MMT Right ?09/18/2021 Left ?09/18/2021  ?Shoulder flexion 5/5 5/5  ?Shoulder abduction 5/5 5/5  ?Middle trapezius 4/5 4/5  ?Lower trapezius 3/5 3/5  ?Latissimus dorsi 4/5 4/5  ?Elbow flexion 5/5 5/5  ?Elbow extension 5/5 5/5  ?Wrist flexion 5/5 5/5  ?Wrist extension 5/5 5/5  ?Grip strength      ? (Blank rows = not tested) ? ? ?TREATMENT 10/01/21: ? ?Therapeutic Exercise: ?- UBE 2.5'/2.5' fwd and backward for warm up while taking subjective ?- row - GTB - 3x10 ?- shoulder ext - 3x10 - GTB ?- corner pec stretch - 35'' x2 ?- thoracic ext on wall with pball ? - w/ alternating lift off ?- open book - 10x ea (L rotation more restricted) ? ?Manual Therapy: ?- sub occipital release ? ? ?HOME EXERCISE PROGRAM: ?Access Code: [email or web address] ?URL: [email or web address] ?Date: 09/18/2021 ?Prepared by: Vanessa [place] ?  ?Exercises ?Seated Cervical Retraction -  1 x daily - 7 x weekly - 3 sets - 10 reps ?Standing Shoulder Row with Anchored Resistance - 1 x daily - 7 x weekly - 3 sets - 10 reps ?Seated Upper Trapezius Stretch - 1 x daily - 7 x weekly - 2 sets - 1-min hold ?Seated Levator Scapulae Stretch - 1 x daily - 7 x weekly - 2 sets - 1-min hold ?  ?  ?ASSESSMENT: ?  ?CLINICAL IMPRESSION: ?Sama is progressing well with therapy.  Pt reports mild pain reduction following therapy.  Today we concentrated on periscapular strengthening, thoracic mobility, and pain reduction.  Pt with positive response to sub occipital release.  Shows restriction in L rotation vs R during open book.  Pt will  continue to benefit from skilled physical therapy to address remaining deficits and achieve listed goals.  Continue per POC. ?  ?  ?OBJECTIVE IMPAIRMENTS decreased mobility, decreased ROM, decreased strength, hypomobility, impaired flexibility, postural dysfunction, and pain.  ?  ?ACTIVITY LIMITATIONS community activity, driving, and shopping.  ?  ?PERSONAL FACTORS 1-2 comorbidities: osteopenia  are also affecting patient's functional outcome.  ?  ?  ?REHAB POTENTIAL: Good ?  ?CLINICAL DECISION MAKING: Stable/uncomplicated ?  ?EVALUATION COMPLEXITY: Low ?  ?  ?GOALS: ?Goals reviewed with patient? Yes ?  ?SHORT TERM GOALS: ?  ?STG Name Target Date Goal status  ?1 The pt will report understanding and adherence to her HEP in order to promote independence in the management of her primary impairments. ?Baseline: HEP provided at eval 10/16/2021 INITIAL  ?  ?LONG TERM GOALS:  ?  ?LTG Name Target Date Goal status  ?1 Pt will achieve BIL cervical rotation AROM of 70 degrees with 0-2/10 pain in order to drive without limitation. ?Baseline: 60 degrees BIL with 6/10 pain 11/13/2021 INITIAL  ?2 Pt will achieve a FOTO score of ___ in order to demonstrate improved functional ability as it relates to her primary impairments. ?Baseline: Administer at treatment 1 11/13/2021 INITIAL  ?3 Pt will achieve BIL global parascapular strength of 4+/5 in order to promote long-term postural improvement in the prophylactic care of future mechanical neck pain. ?Baseline: See MMT chart 11/13/2021 INITIAL  ?4 Pt will report ability to wear scarfs with 0/10 pain in order to dress for the cold weather. ?Baseline: Unable to wear scarfs >30 minutes due to increased neck pain 11/13/2021 INITIAL  ?  ?PLAN: ?PT FREQUENCY: 1x/week ?  ?PT DURATION: 8 weeks ?  ?PLANNED INTERVENTIONS: Therapeutic exercises, Therapeutic activity, Neuro Muscular re-education, Patient/Family education, Joint mobilization, Dry Needling, Spinal mobilization, Cryotherapy, Moist heat,  Taping, Traction, and Manual therapy ?  ?PLAN FOR NEXT SESSION: Administer FOTO, assess response to HEP, progress DNF/ parascapular strengthening/ mobility, manual techniques for pain modulation ? ? ? ?Mathis Dad PT ?10/01/2021, 10:03 AM ? ?  ?

## 2021-10-07 DIAGNOSIS — N3281 Overactive bladder: Secondary | ICD-10-CM | POA: Diagnosis not present

## 2021-10-11 ENCOUNTER — Other Ambulatory Visit: Payer: Self-pay

## 2021-10-11 ENCOUNTER — Ambulatory Visit: Payer: Medicare Other | Admitting: Physical Therapy

## 2021-10-11 ENCOUNTER — Encounter: Payer: Self-pay | Admitting: Physical Therapy

## 2021-10-11 DIAGNOSIS — M6281 Muscle weakness (generalized): Secondary | ICD-10-CM

## 2021-10-11 DIAGNOSIS — M542 Cervicalgia: Secondary | ICD-10-CM | POA: Diagnosis not present

## 2021-10-11 NOTE — Therapy (Signed)
?OUTPATIENT PHYSICAL THERAPY TREATMENT NOTE ? ? ?Patient Name: Cassandra Morrison ?MRN: [id number] ?DOB:Jul 02, 1941, 81 y.o., female ?Today's Date: 10/11/2021 ? ?PCP: Lajean Manes, MD ?REFERRING PROVIDER: Lajean Manes, MD ? ? PT End of Session - 10/11/21 1123   ? ? Visit Number 3   ? Number of Visits 9   ? Date for PT Re-Evaluation 11/13/21   ? Authorization Type MCR   ? Authorization Time Period FOTO v6, v10   ? Progress Note Due on Visit 10   ? PT Start Time 1122   ? PT Stop Time 1200   ? PT Time Calculation (min) 38 min   ? Activity Tolerance Patient tolerated treatment well   ? Behavior During Therapy [facility] for tasks assessed/performed   ? ?  ?  ? ?  ? ? ? ?Past Medical History:  ?Diagnosis Date  ? Complication of anesthesia   ? nausea   ? CPAP (continuous positive airway pressure) dependence   ? Depression   ? Diverticulitis   ? GERD (gastroesophageal reflux disease)   ? Hypercholesterolemia   ? Nephrolithiasis   ? OAB (overactive bladder)   ? Obesity   ? Osteopenia   ? Sciatica   ? Sleep apnea   ? ?Past Surgical History:  ?Procedure Laterality Date  ? COLONOSCOPY WITH PROPOFOL N/A 05/20/2015  ? Procedure: COLONOSCOPY WITH PROPOFOL;  Surgeon: Garlan Fair, MD;  Location: [facility];  Service: Endoscopy;  Laterality: N/A;  ? ?Patient Active Problem List  ? Diagnosis Date Noted  ? Spider veins 05/29/2015  ? Paroxysmal supraventricular tachycardia ([place]) 01/29/2014  ? Chest tightness 01/29/2014  ? 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 OSA on CPAP 06/09/2013  ? Obesity, unspecified 06/09/2013  ? ? ?REFERRING DIAG: M54.2 (ICD-10-CM) - Cervicalgia ? ?THERAPY DIAG:  ?Neck pain ? ?Muscle weakness (generalized) ? ?PERTINENT HISTORY: osteopenia ? ?PRECAUTIONS/RESTRICTIONS:  ? ?none ? ?SUBJECTIVE:  ?Pt reports that her neck feels good unless she rotates. ? ?PAIN:  ?Are you having pain? Yes ?NPRS scale: 2/10 ?Pain location: Posterior neck ?PAIN TYPE: aching ?Pain description: intermittent  ?Aggravating factors:  Turning head, wearing scarf ?Relieving factors: pain medication ? ?OBJECTIVE: ? ?CERVICAL AROM ?  ?AROM AROM (deg) ?09/18/2021  ?Flexion 70  ?Extension 70  ?Right lateral flexion 30, stretch in Lt upper trap  ?Left lateral flexion 40  ?Right rotation 60p!  ?Left rotation 60p!  ? (Blank rows = not tested) ?  ?  ?UE MMT: ?  ?MMT Right ?09/18/2021 Left ?09/18/2021  ?Shoulder flexion 5/5 5/5  ?Shoulder abduction 5/5 5/5  ?Middle trapezius 4/5 4/5  ?Lower trapezius 3/5 3/5  ?Latissimus dorsi 4/5 4/5  ?Elbow flexion 5/5 5/5  ?Elbow extension 5/5 5/5  ?Wrist flexion 5/5 5/5  ?Wrist extension 5/5 5/5  ?Grip strength      ? (Blank rows = not tested) ? ? ?TREATMENT 10/11/21: ? ?Therapeutic Exercise: ?- UBE 2.5'/2.5' fwd and backward for warm up while taking subjective ?- row - 13# - 2x10 ?- Prone T,I, Y 2x10 ea ? ?Manual Therapy: ?- skilled palpation for TDN ?- STM bil ut ? ?Trigger Point Dry-Needling  ?Treatment instructions: Expect mild to moderate muscle soreness. S/S of pneumothorax if dry needled over a lung field, and to seek immediate medical attention should they occur. Patient verbalized understanding of these instructions and education. ? ?Patient Consent Given: Yes ?Education handout provided: No ?Muscles treated: L and R UT ?Electrical stimulation performed: No ?Parameters: N/A ?Treatment response/outcome: twitch ? ? ? ?HOME  EXERCISE PROGRAM: ?Access Code: [email or web address] ?URL: [email or web address] ?Date: 09/18/2021 ?Prepared by: Vanessa [place] ?  ?Exercises ?Seated Cervical Retraction - 1 x daily - 7 x weekly - 3 sets - 10 reps ?Standing Shoulder Row with Anchored Resistance - 1 x daily - 7 x weekly - 3 sets - 10 reps ?Seated Upper Trapezius Stretch - 1 x daily - 7 x weekly - 2 sets - 1-min hold ?Seated Levator Scapulae Stretch - 1 x daily - 7 x weekly - 2 sets - 1-min hold ?  ?  ?ASSESSMENT: ?  ?CLINICAL IMPRESSION: ?Trialed TDN to upper traps today.  No adverse reaction.  Good twitch response bil UT,  especially lateral portion.  Added in I/Y/T today with high level of fatigue but no increase in pain.  Reviewed chin tuck for HEP. ?  ?  ?OBJECTIVE IMPAIRMENTS decreased mobility, decreased ROM, decreased strength, hypomobility, impaired flexibility, postural dysfunction, and pain.  ?  ?ACTIVITY LIMITATIONS community activity, driving, and shopping.  ?  ?PERSONAL FACTORS 1-2 comorbidities: osteopenia  are also affecting patient's functional outcome.  ?  ?  ?REHAB POTENTIAL: Good ?  ?CLINICAL DECISION MAKING: Stable/uncomplicated ?  ?EVALUATION COMPLEXITY: Low ?  ?  ?GOALS: ?Goals reviewed with patient? Yes ?  ?SHORT TERM GOALS: ?  ?STG Name Target Date Goal status  ?1 The pt will report understanding and adherence to her HEP in order to promote independence in the management of her primary impairments. ?Baseline: HEP provided at eval 10/16/2021 INITIAL  ?  ?LONG TERM GOALS:  ?  ?LTG Name Target Date Goal status  ?1 Pt will achieve BIL cervical rotation AROM of 70 degrees with 0-2/10 pain in order to drive without limitation. ?Baseline: 60 degrees BIL with 6/10 pain 11/13/2021 INITIAL  ?2 Pt will achieve a FOTO score of ___ in order to demonstrate improved functional ability as it relates to her primary impairments. ?Baseline: Administer at treatment 1 11/13/2021 INITIAL  ?3 Pt will achieve BIL global parascapular strength of 4+/5 in order to promote long-term postural improvement in the prophylactic care of future mechanical neck pain. ?Baseline: See MMT chart 11/13/2021 INITIAL  ?4 Pt will report ability to wear scarfs with 0/10 pain in order to dress for the cold weather. ?Baseline: Unable to wear scarfs >30 minutes due to increased neck pain 11/13/2021 INITIAL  ?  ?PLAN: ?PT FREQUENCY: 1x/week ?  ?PT DURATION: 8 weeks ?  ?PLANNED INTERVENTIONS: Therapeutic exercises, Therapeutic activity, Neuro Muscular re-education, Patient/Family education, Joint mobilization, Dry Needling, Spinal mobilization, Cryotherapy, Moist  heat, Taping, Traction, and Manual therapy ?  ?PLAN FOR NEXT SESSION: Administer FOTO, assess response to HEP, progress DNF/ parascapular strengthening/ mobility, manual techniques for pain modulation ? ? ? ?Mathis Dad PT ?10/11/2021, 12:01 PM ? ?  ?

## 2021-10-11 NOTE — Therapy (Incomplete)
?OUTPATIENT PHYSICAL THERAPY TREATMENT NOTE ? ? ?Patient Name: Cassandra Morrison ?MRN: [id number] ?DOB:1941/04/17, 80 y.o., female ?Today's Date: 10/11/2021 ? ?PCP: Lajean Manes, MD ?REFERRING PROVIDER: Lajean Manes, MD ? ? ? ? ?Past Medical History:  ?Diagnosis Date  ? Complication of anesthesia   ? nausea   ? CPAP (continuous positive airway pressure) dependence   ? Depression   ? Diverticulitis   ? GERD (gastroesophageal reflux disease)   ? Hypercholesterolemia   ? Nephrolithiasis   ? OAB (overactive bladder)   ? Obesity   ? Osteopenia   ? Sciatica   ? Sleep apnea   ? ?Past Surgical History:  ?Procedure Laterality Date  ? COLONOSCOPY WITH PROPOFOL N/A 05/20/2015  ? Procedure: COLONOSCOPY WITH PROPOFOL;  Surgeon: Garlan Fair, MD;  Location: [facility];  Service: Endoscopy;  Laterality: N/A;  ? ?Patient Active Problem List  ? Diagnosis Date Noted  ? Spider veins 05/29/2015  ? Paroxysmal supraventricular tachycardia ([place]) 01/29/2014  ? Chest tightness 01/29/2014  ? 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 OSA on CPAP 06/09/2013  ? Obesity, unspecified 06/09/2013  ? ? ?REFERRING DIAG: M54.2 (ICD-10-CM) - Cervicalgia ? ?THERAPY DIAG:  ?No diagnosis found. ? ?PERTINENT HISTORY: osteopenia ? ?PRECAUTIONS/RESTRICTIONS:  ? ?none ? ?SUBJECTIVE:  ?*** ? ?PAIN:  ?Are you having pain? Yes ?NPRS scale: 2/10 ?Pain location: Posterior neck ?PAIN TYPE: aching ?Pain description: intermittent  ?Aggravating factors: Turning head, wearing scarf ?Relieving factors: pain medication ? ?OBJECTIVE: ?*Unless otherwise noted, objective information collected previously* ? ?CERVICAL AROM ?  ?AROM AROM (deg) ?09/18/2021  ?Flexion 70  ?Extension 70  ?Right lateral flexion 30, stretch in Lt upper trap  ?Left lateral flexion 40  ?Right rotation 60p!  ?Left rotation 60p!  ? (Blank rows = not tested) ?  ?  ?UE MMT: ?  ?MMT Right ?09/18/2021 Left ?09/18/2021  ?Shoulder flexion 5/5 5/5  ?Shoulder abduction 5/5 5/5  ?Middle trapezius 4/5 4/5   ?Lower trapezius 3/5 3/5  ?Latissimus dorsi 4/5 4/5  ?Elbow flexion 5/5 5/5  ?Elbow extension 5/5 5/5  ?Wrist flexion 5/5 5/5  ?Wrist extension 5/5 5/5  ?Grip strength      ? (Blank rows = not tested) ? ? ?[facility] PT Treatment:                                                DATE: 10/14/2021 ?Therapeutic Exercise: ?*** ?Manual Therapy: ?*** ?Neuromuscular re-ed: ?*** ?Therapeutic Activity: ?*** ?Modalities: ?*** ?Self Care: ?*** ? ? ?TREATMENT 10/01/21: ? ?Therapeutic Exercise: ?- UBE 2.5'/2.5' fwd and backward for warm up while taking subjective ?- row - GTB - 3x10 ?- shoulder ext - 3x10 - GTB ?- corner pec stretch - 55'' x2 ?- thoracic ext on wall with pball ? - w/ alternating lift off ?- open book - 10x ea (L rotation more restricted) ? ?Manual Therapy: ?- sub occipital release ? ? ?HOME EXERCISE PROGRAM: ?Access Code: [email or web address] ?URL: [email or web address] ?Date: 09/18/2021 ?Prepared by: Vanessa [place] ?  ?Exercises ?Seated Cervical Retraction - 1 x daily - 7 x weekly - 3 sets - 10 reps ?Standing Shoulder Row with Anchored Resistance - 1 x daily - 7 x weekly - 3 sets - 10 reps ?Seated Upper Trapezius Stretch - 1 x daily - 7 x weekly - 2 sets - 1-min hold ?Seated Levator Scapulae Stretch - 1 x  daily - 7 x weekly - 2 sets - 1-min hold ?  ?  ?ASSESSMENT: ?  ?CLINICAL IMPRESSION: ?*** ?  ?  ?OBJECTIVE IMPAIRMENTS decreased mobility, decreased ROM, decreased strength, hypomobility, impaired flexibility, postural dysfunction, and pain.  ?  ?ACTIVITY LIMITATIONS community activity, driving, and shopping.  ?  ?PERSONAL FACTORS 1-2 comorbidities: osteopenia  are also affecting patient's functional outcome.  ?  ?  ?REHAB POTENTIAL: Good ?  ?CLINICAL DECISION MAKING: Stable/uncomplicated ?  ?EVALUATION COMPLEXITY: Low ?  ?  ?GOALS: ?Goals reviewed with patient? Yes ?  ?SHORT TERM GOALS: ?  ?STG Name Target Date Goal status  ?1 The pt will report understanding and adherence to her HEP in order to promote  independence in the management of her primary impairments. ?Baseline: HEP provided at eval 10/16/2021 INITIAL  ?  ?LONG TERM GOALS:  ?  ?LTG Name Target Date Goal status  ?1 Pt will achieve BIL cervical rotation AROM of 70 degrees with 0-2/10 pain in order to drive without limitation. ?Baseline: 60 degrees BIL with 6/10 pain 11/13/2021 INITIAL  ?2 Pt will achieve a FOTO score of ___ in order to demonstrate improved functional ability as it relates to her primary impairments. ?Baseline: Administer at treatment 1 11/13/2021 INITIAL  ?3 Pt will achieve BIL global parascapular strength of 4+/5 in order to promote long-term postural improvement in the prophylactic care of future mechanical neck pain. ?Baseline: See MMT chart 11/13/2021 INITIAL  ?4 Pt will report ability to wear scarfs with 0/10 pain in order to dress for the cold weather. ?Baseline: Unable to wear scarfs >30 minutes due to increased neck pain 11/13/2021 INITIAL  ?  ?PLAN: ?PT FREQUENCY: 1x/week ?  ?PT DURATION: 8 weeks ?  ?PLANNED INTERVENTIONS: Therapeutic exercises, Therapeutic activity, Neuro Muscular re-education, Patient/Family education, Joint mobilization, Dry Needling, Spinal mobilization, Cryotherapy, Moist heat, Taping, Traction, and Manual therapy ?  ?PLAN FOR NEXT SESSION: Administer FOTO, assess response to HEP, progress DNF/ parascapular strengthening/ mobility, manual techniques for pain modulation ? ? ? ?Vanessa [place], PT, DPT ?10/11/21 8:42 AM ? ? ?  ?

## 2021-10-14 ENCOUNTER — Ambulatory Visit: Payer: Medicare Other

## 2021-10-14 ENCOUNTER — Other Ambulatory Visit: Payer: Self-pay

## 2021-10-14 DIAGNOSIS — M542 Cervicalgia: Secondary | ICD-10-CM

## 2021-10-14 DIAGNOSIS — M25572 Pain in left ankle and joints of left foot: Secondary | ICD-10-CM | POA: Diagnosis not present

## 2021-10-14 DIAGNOSIS — M6281 Muscle weakness (generalized): Secondary | ICD-10-CM | POA: Diagnosis not present

## 2021-10-14 NOTE — Therapy (Signed)
?OUTPATIENT PHYSICAL THERAPY TREATMENT NOTE ? ? ?Patient Name: Cassandra Morrison ?MRN: [id number] ?DOB:Aug 17, 1940, 81 y.o., female ?Today's Date: 10/14/2021 ? ?PCP: Lajean Manes, MD ?REFERRING PROVIDER: Lajean Manes, MD ? ? PT End of Session - 10/14/21 1222   ? ? Visit Number 4   ? Number of Visits 9   ? Date for PT Re-Evaluation 11/13/21   ? Authorization Type MCR   ? Authorization Time Period FOTO v6, v10   ? Progress Note Due on Visit 10   ? PT Start Time 3846   Pt arrived 7 minutes late to her appointment.  ? PT Stop Time 1300   ? PT Time Calculation (min) 38 min   ? Activity Tolerance Patient tolerated treatment well   ? Behavior During Therapy [facility] for tasks assessed/performed   ? ?  ?  ? ?  ? ? ? ?Past Medical History:  ?Diagnosis Date  ? Complication of anesthesia   ? nausea   ? CPAP (continuous positive airway pressure) dependence   ? Depression   ? Diverticulitis   ? GERD (gastroesophageal reflux disease)   ? Hypercholesterolemia   ? Nephrolithiasis   ? OAB (overactive bladder)   ? Obesity   ? Osteopenia   ? Sciatica   ? Sleep apnea   ? ?Past Surgical History:  ?Procedure Laterality Date  ? COLONOSCOPY WITH PROPOFOL N/A 05/20/2015  ? Procedure: COLONOSCOPY WITH PROPOFOL;  Surgeon: Garlan Fair, MD;  Location: [facility];  Service: Endoscopy;  Laterality: N/A;  ? ?Patient Active Problem List  ? Diagnosis Date Noted  ? Spider veins 05/29/2015  ? Paroxysmal supraventricular tachycardia ([place]) 01/29/2014  ? Chest tightness 01/29/2014  ? 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 OSA on CPAP 06/09/2013  ? Obesity, unspecified 06/09/2013  ? ? ?REFERRING DIAG: M54.2 (ICD-10-CM) - Cervicalgia ? ?THERAPY DIAG:  ?Neck pain ? ?Muscle weakness (generalized) ? ?PERTINENT HISTORY: osteopenia ? ?PRECAUTIONS/RESTRICTIONS:  ? ?none ? ?SUBJECTIVE:  ?Pt reports continued 5/10 cervical pain today, adding that she wasn't able to consistently perform her HEP due to being out of town. ? ?PAIN:  ?Are you having pain?  Yes ?NPRS scale: 5/10 ?Pain location: Posterior neck ?PAIN TYPE: aching ?Pain description: intermittent  ?Aggravating factors: Turning head, wearing scarf ?Relieving factors: pain medication ? ?OBJECTIVE: ?*Unless otherwise noted, objective information collected previously* ? ?FOTO: 47%, pedicted 61% in 11 visits ? ?CERVICAL AROM ?  ?AROM AROM (deg) ?09/18/2021  ?Flexion 70  ?Extension 70  ?Right lateral flexion 30, stretch in Lt upper trap  ?Left lateral flexion 40  ?Right rotation 60p!  ?Left rotation 60p!  ? (Blank rows = not tested) ?  ?  ?UE MMT: ?  ?MMT Right ?09/18/2021 Left ?09/18/2021  ?Shoulder flexion 5/5 5/5  ?Shoulder abduction 5/5 5/5  ?Middle trapezius 4/5 4/5  ?Lower trapezius 3/5 3/5  ?Latissimus dorsi 4/5 4/5  ?Elbow flexion 5/5 5/5  ?Elbow extension 5/5 5/5  ?Wrist flexion 5/5 5/5  ?Wrist extension 5/5 5/5  ?Grip strength      ? (Blank rows = not tested) ? ? ?[facility] Adult PT Treatment:                                                DATE: 10/14/2021 ?Therapeutic Exercise: ?Seated low rows with 20# cable 2x10 ?Seated high rows with 20# cable 2x10 ?Seated lat pull-downs  with 20# cable 2x10 ?Seated shoulder rolls 2x10 forward and backward ?Prone swimmers with chin tuck hold and YTB between wrists 3x20 ?Cat cows 3x10 ?Seated PNF D2 shoulder flexion 2x10 BIL ?Manual Therapy: ?Manual cervical distraction x2 minutes ?Suboccipital release x2 minutes ?Effleurage to cervical paraspinals and upper trap x6 minutes ?Neuromuscular re-ed: ?N/A ?Therapeutic Activity: ?FOTO administration and education ?Modalities: ?N/A ?Self Care: ?N/A ? ? ?TREATMENT 10/01/21: ? ?Therapeutic Exercise: ?- UBE 2.5'/2.5' fwd and backward for warm up while taking subjective ?- row - GTB - 3x10 ?- shoulder ext - 3x10 - GTB ?- corner pec stretch - 27'' x2 ?- thoracic ext on wall with pball ? - w/ alternating lift off ?- open book - 10x ea (L rotation more restricted) ? ?Manual Therapy: ?- sub occipital release ? ? ?HOME EXERCISE  PROGRAM: ?Access Code: [email or web address] ?URL: [email or web address] ?Date: 09/18/2021 ?Prepared by: Vanessa [place] ?  ?Exercises ?Seated Cervical Retraction - 1 x daily - 7 x weekly - 3 sets - 10 reps ?Standing Shoulder Row with Anchored Resistance - 1 x daily - 7 x weekly - 3 sets - 10 reps ?Seated Upper Trapezius Stretch - 1 x daily - 7 x weekly - 2 sets - 1-min hold ?Seated Levator Scapulae Stretch - 1 x daily - 7 x weekly - 2 sets - 1-min hold ?  ?  ?ASSESSMENT: ?  ?CLINICAL IMPRESSION: ?Pt responded well to all interventions today, demonstrating good form and no increase in pain with selected exercises. She reports a therapeutic response to manual techniques today. Upon FOTO administration, new FOTO goal was established as pt has a relatively love functional score and good predicted improvement. She will continue to benefit from skilled PT to address her primary impairments and return to her prior level of function with less limitation. ?  ?  ?OBJECTIVE IMPAIRMENTS decreased mobility, decreased ROM, decreased strength, hypomobility, impaired flexibility, postural dysfunction, and pain.  ?  ?ACTIVITY LIMITATIONS community activity, driving, and shopping.  ?  ?PERSONAL FACTORS 1-2 comorbidities: osteopenia  are also affecting patient's functional outcome.  ?  ?  ?REHAB POTENTIAL: Good ?  ?CLINICAL DECISION MAKING: Stable/uncomplicated ?  ?EVALUATION COMPLEXITY: Low ?  ?  ?GOALS: ?Goals reviewed with patient? Yes ?  ?SHORT TERM GOALS: ?  ?STG Name Target Date Goal status  ?1 The pt will report understanding and adherence to her HEP in order to promote independence in the management of her primary impairments. ?Baseline: HEP provided at eval 10/16/2021 INITIAL  ?  ?LONG TERM GOALS:  ?  ?LTG Name Target Date Goal status  ?1 Pt will achieve BIL cervical rotation AROM of 70 degrees with 0-2/10 pain in order to drive without limitation. ?Baseline: 60 degrees BIL with 6/10 pain 11/13/2021 INITIAL  ?2 Pt will  achieve a FOTO score of 61% in order to demonstrate improved functional ability as it relates to her primary impairments. ?Baseline: 47% at visit 4 11/13/2021 INITIAL  ?3 Pt will achieve BIL global parascapular strength of 4+/5 in order to promote long-term postural improvement in the prophylactic care of future mechanical neck pain. ?Baseline: See MMT chart 11/13/2021 INITIAL  ?4 Pt will report ability to wear scarfs with 0/10 pain in order to dress for the cold weather. ?Baseline: Unable to wear scarfs >30 minutes due to increased neck pain 11/13/2021 INITIAL  ?  ?PLAN: ?PT FREQUENCY: 1x/week ?  ?PT DURATION: 8 weeks ?  ?PLANNED INTERVENTIONS: Therapeutic exercises, Therapeutic activity, Neuro Muscular re-education, Patient/Family education, Joint mobilization, Dry Needling, Spinal  mobilization, Cryotherapy, Moist heat, Taping, Traction, and Manual therapy ?  ?PLAN FOR NEXT SESSION: Administer FOTO, assess response to HEP, progress DNF/ parascapular strengthening/ mobility, manual techniques for pain modulation ? ? ? ?Vanessa [place], PT, DPT ?10/14/21 12:59 PM ? ? ?  ?

## 2021-10-21 DIAGNOSIS — N3281 Overactive bladder: Secondary | ICD-10-CM | POA: Diagnosis not present

## 2021-10-22 ENCOUNTER — Ambulatory Visit: Payer: Medicare Other

## 2021-10-22 DIAGNOSIS — L84 Corns and callosities: Secondary | ICD-10-CM | POA: Diagnosis not present

## 2021-10-22 DIAGNOSIS — M6281 Muscle weakness (generalized): Secondary | ICD-10-CM | POA: Diagnosis not present

## 2021-10-22 DIAGNOSIS — H353132 Nonexudative age-related macular degeneration, bilateral, intermediate dry stage: Secondary | ICD-10-CM | POA: Diagnosis not present

## 2021-10-22 DIAGNOSIS — S93402A Sprain of unspecified ligament of left ankle, initial encounter: Secondary | ICD-10-CM | POA: Diagnosis not present

## 2021-10-22 DIAGNOSIS — M542 Cervicalgia: Secondary | ICD-10-CM

## 2021-10-22 NOTE — Therapy (Signed)
?OUTPATIENT PHYSICAL THERAPY TREATMENT NOTE ? ? ?Patient Name: Cassandra Morrison ?MRN: [id number] ?DOB:[birth year], 81 y.o., female ?Today's Date: 10/22/2021 ? ?PCP: Lajean Manes, MD ?REFERRING PROVIDER: Lajean Manes, MD ? ? PT End of Session - 10/22/21 1745   ? ? Visit Number 5   ? Number of Visits 9   ? Date for PT Re-Evaluation 11/13/21   ? Authorization Type MCR   ? Authorization Time Period FOTO v6, v10   ? Progress Note Due on Visit 10   ? PT Start Time 4235   ? PT Stop Time 3614   ? PT Time Calculation (min) 45 min   ? Activity Tolerance Patient tolerated treatment well   ? Behavior During Therapy [facility] for tasks assessed/performed   ? ?  ?  ? ?  ? ? ? ? ?Past Medical History:  ?Diagnosis Date  ? Complication of anesthesia   ? nausea   ? CPAP (continuous positive airway pressure) dependence   ? Depression   ? Diverticulitis   ? GERD (gastroesophageal reflux disease)   ? Hypercholesterolemia   ? Nephrolithiasis   ? OAB (overactive bladder)   ? Obesity   ? Osteopenia   ? Sciatica   ? Sleep apnea   ? ?Past Surgical History:  ?Procedure Laterality Date  ? COLONOSCOPY WITH PROPOFOL N/A 05/20/2015  ? Procedure: COLONOSCOPY WITH PROPOFOL;  Surgeon: Garlan Fair, MD;  Location: [facility];  Service: Endoscopy;  Laterality: N/A;  ? ?Patient Active Problem List  ? Diagnosis Date Noted  ? Spider veins 05/29/2015  ? Paroxysmal supraventricular tachycardia ([place]) 01/29/2014  ? Chest tightness 01/29/2014  ? 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 OSA on CPAP 06/09/2013  ? Obesity, unspecified 06/09/2013  ? ? ?REFERRING DIAG: M54.2 (ICD-10-CM) - Cervicalgia ? ?THERAPY DIAG:  ?Neck pain ? ?Muscle weakness (generalized) ? ?PERTINENT HISTORY: osteopenia ? ?PRECAUTIONS/RESTRICTIONS:  ? ?none ? ?SUBJECTIVE:  ?Pt reports no neck pain currently, only having 4-5/10 pain when turning her head to the Rt. She reports not doing her HEP due to frequent traveling over the past week. ? ?PAIN:  ?Are you having pain? Yes ?NPRS scale:  0/10 ?Pain location: Posterior neck ?PAIN TYPE: aching ?Pain description: intermittent  ?Aggravating factors: Turning head, wearing scarf ?Relieving factors: pain medication ? ?OBJECTIVE: ?*Unless otherwise noted, objective information collected previously* ? ?FOTO: 47%, pedicted 61% in 11 visits ? ?CERVICAL AROM ?  ?AROM AROM (deg) ?09/18/2021  ?Flexion 70  ?Extension 70  ?Right lateral flexion 30, stretch in Lt upper trap  ?Left lateral flexion 40  ?Right rotation 60p!  ?Left rotation 60p!  ? (Blank rows = not tested) ?  ?  ?UE MMT: ?  ?MMT Right ?09/18/2021 Left ?09/18/2021  ?Shoulder flexion 5/5 5/5  ?Shoulder abduction 5/5 5/5  ?Middle trapezius 4/5 4/5  ?Lower trapezius 3/5 3/5  ?Latissimus dorsi 4/5 4/5  ?Elbow flexion 5/5 5/5  ?Elbow extension 5/5 5/5  ?Wrist flexion 5/5 5/5  ?Wrist extension 5/5 5/5  ?Grip strength      ? (Blank rows = not tested) ? ? ?[facility] Adult PT Treatment:                                                DATE: 10/22/2021 ?Therapeutic Exercise: ?Seated low rows with 25# cable 2x10 ?Seated high rows with 25# cable 2x10 ?Seated lat pull-downs  with 25# cable 2x10 ?Seated shoulder rolls 2x10 forward and backward ?Prone Y with 1# dumbbells 3x10 ?Cat cows 3x10 ?Seated cervical isotonic contractions against handhold resistance x10 BIL ?Manual Therapy: ?Supine cervical distraction x2 minutes ?Supine cervical retraction with Rt rotation x1 minute ?Prone Rt grade 3 upglides to C3-C5 x6 minutes ?Prone effleurage to BIL UT/ MT x8 minutes ?Neuromuscular re-ed: ?N/A ?Therapeutic Activity: ?N/A ?Modalities: ?N/A ?Self Care: ?N/A ? ? ?[place] Adult PT Treatment:                                                DATE: 10/14/2021 ?Therapeutic Exercise: ?Seated low rows with 20# cable 2x10 ?Seated high rows with 20# cable 2x10 ?Seated lat pull-downs with 20# cable 2x10 ?Seated shoulder rolls 2x10 forward and backward ?Prone swimmers with chin tuck hold and YTB between wrists 3x20 ?Cat cows 3x10 ?Seated PNF D2 shoulder  flexion 2x10 BIL ?Manual Therapy: ?Manual cervical distraction x2 minutes ?Suboccipital release x2 minutes ?Effleurage to cervical paraspinals and upper trap x6 minutes ?Neuromuscular re-ed: ?N/A ?Therapeutic Activity: ?FOTO administration and education ?Modalities: ?N/A ?Self Care: ?N/A ? ? ?TREATMENT 10/01/21: ? ?Therapeutic Exercise: ?- UBE 2.5'/2.5' fwd and backward for warm up while taking subjective ?- row - GTB - 3x10 ?- shoulder ext - 3x10 - GTB ?- corner pec stretch - 36'' x2 ?- thoracic ext on wall with pball ? - w/ alternating lift off ?- open book - 10x ea (L rotation more restricted) ? ?Manual Therapy: ?- sub occipital release ? ? ?HOME EXERCISE PROGRAM: ?Access Code: [email or web address] ?URL: [email or web address] ?Date: 09/18/2021 ?Prepared by: Vanessa [place] ?  ?Exercises ?Seated Cervical Retraction - 1 x daily - 7 x weekly - 3 sets - 10 reps ?Standing Shoulder Row with Anchored Resistance - 1 x daily - 7 x weekly - 3 sets - 10 reps ?Seated Upper Trapezius Stretch - 1 x daily - 7 x weekly - 2 sets - 1-min hold ?Seated Levator Scapulae Stretch - 1 x daily - 7 x weekly - 2 sets - 1-min hold ?  ?  ?ASSESSMENT: ?  ?CLINICAL IMPRESSION: ?Pt responded well to all interventions today, demonstrating good form and no pain with completed exercises. The pt reports some soreness after manual techniques, but reports an overall therapeutic response. She will continue to benefit from skilled PT to address her primary impairments and return to her prior level of function with less limitation. ?  ?  ?OBJECTIVE IMPAIRMENTS decreased mobility, decreased ROM, decreased strength, hypomobility, impaired flexibility, postural dysfunction, and pain.  ?  ?ACTIVITY LIMITATIONS community activity, driving, and shopping.  ?  ?PERSONAL FACTORS 1-2 comorbidities: osteopenia  are also affecting patient's functional outcome.  ?  ?  ?REHAB POTENTIAL: Good ?  ?CLINICAL DECISION MAKING: Stable/uncomplicated ?  ?EVALUATION  COMPLEXITY: Low ?  ?  ?GOALS: ?Goals reviewed with patient? Yes ?  ?SHORT TERM GOALS: ?  ?STG Name Target Date Goal status  ?1 The pt will report understanding and adherence to her HEP in order to promote independence in the management of her primary impairments. ?Baseline: HEP provided at eval 10/16/2021 INITIAL  ?  ?LONG TERM GOALS:  ?  ?LTG Name Target Date Goal status  ?1 Pt will achieve BIL cervical rotation AROM of 70 degrees with 0-2/10 pain in order to drive without limitation. ?Baseline: 60 degrees BIL with 6/10 pain 11/13/2021 INITIAL  ?  2 Pt will achieve a FOTO score of 61% in order to demonstrate improved functional ability as it relates to her primary impairments. ?Baseline: 47% at visit 4 11/13/2021 INITIAL  ?3 Pt will achieve BIL global parascapular strength of 4+/5 in order to promote long-term postural improvement in the prophylactic care of future mechanical neck pain. ?Baseline: See MMT chart 11/13/2021 INITIAL  ?4 Pt will report ability to wear scarfs with 0/10 pain in order to dress for the cold weather. ?Baseline: Unable to wear scarfs >30 minutes due to increased neck pain 11/13/2021 INITIAL  ?  ?PLAN: ?PT FREQUENCY: 1x/week ?  ?PT DURATION: 8 weeks ?  ?PLANNED INTERVENTIONS: Therapeutic exercises, Therapeutic activity, Neuro Muscular re-education, Patient/Family education, Joint mobilization, Dry Needling, Spinal mobilization, Cryotherapy, Moist heat, Taping, Traction, and Manual therapy ?  ?PLAN FOR NEXT SESSION: Administer FOTO, assess response to HEP, progress DNF/ parascapular strengthening/ mobility, manual techniques for pain modulation ? ? ? ?Vanessa [place], PT, DPT ?10/22/21 6:28 PM ? ? ?  ?

## 2021-10-25 NOTE — Therapy (Signed)
?OUTPATIENT PHYSICAL THERAPY TREATMENT NOTE ? ? ?Patient Name: Cassandra Morrison ?MRN: [id number] ?DOB:1941-04-26, 81 y.o., female ?Today's Date: 10/27/2021 ? ?PCP: Lajean Manes, MD ?REFERRING PROVIDER: Lajean Manes, MD ? ? PT End of Session - 10/27/21 1222   ? ? Visit Number 6   ? Number of Visits 9   ? Date for PT Re-Evaluation 11/13/21   ? Authorization Type MCR   ? Authorization Time Period FOTO v6, v10   ? Progress Note Due on Visit 10   ? PT Start Time 7915   Pt arrived 6 minutes late to her appointment.  ? PT Stop Time 1300   ? PT Time Calculation (min) 38 min   ? Activity Tolerance Patient tolerated treatment well   ? Behavior During Therapy [facility] for tasks assessed/performed   ? ?  ?  ? ?  ? ? ? ? ? ?Past Medical History:  ?Diagnosis Date  ? Complication of anesthesia   ? nausea   ? CPAP (continuous positive airway pressure) dependence   ? Depression   ? Diverticulitis   ? GERD (gastroesophageal reflux disease)   ? Hypercholesterolemia   ? Nephrolithiasis   ? OAB (overactive bladder)   ? Obesity   ? Osteopenia   ? Sciatica   ? Sleep apnea   ? ?Past Surgical History:  ?Procedure Laterality Date  ? COLONOSCOPY WITH PROPOFOL N/A 05/20/2015  ? Procedure: COLONOSCOPY WITH PROPOFOL;  Surgeon: Garlan Fair, MD;  Location: [facility];  Service: Endoscopy;  Laterality: N/A;  ? ?Patient Active Problem List  ? Diagnosis Date Noted  ? Spider veins 05/29/2015  ? Paroxysmal supraventricular tachycardia ([place]) 01/29/2014  ? Chest tightness 01/29/2014  ? 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 OSA on CPAP 06/09/2013  ? Obesity, unspecified 06/09/2013  ? ? ?REFERRING DIAG: M54.2 (ICD-10-CM) - Cervicalgia ? ?THERAPY DIAG:  ?Neck pain ? ?Muscle weakness (generalized) ? ?PERTINENT HISTORY: osteopenia ? ?PRECAUTIONS/RESTRICTIONS:  ? ?none ? ?SUBJECTIVE:  ?Pt reports no pain currently, although she reports severe pain last night when laying down. She reports that she had difficulty turning her head last night, although it  is better today. ? ?PAIN:  ?Are you having pain? Yes ?NPRS scale: 0/10 ?Pain location: Posterior neck ?PAIN TYPE: aching ?Pain description: intermittent  ?Aggravating factors: Turning head, wearing scarf ?Relieving factors: pain medication ? ?OBJECTIVE: ?*Unless otherwise noted, objective information collected previously* ? ?FOTO: 47%, pedicted 61% in 11 visits ?10/27/2021: 59% ? ?CERVICAL AROM ?  ?AROM AROM (deg) ?09/18/2021 AROM ?(Deg) ?10/27/2021  ?Flexion 70   ?Extension 70   ?Right lateral flexion 30, stretch in Lt upper trap 25  ?Left lateral flexion 40 25  ?Right rotation 60p! 60p!, 68 with mild pain following MET  ?Left rotation 60p! 60p!, 70p! Following MET  ? (Blank rows = not tested) ?  ?  ?UE MMT: ?  ?MMT Right ?09/18/2021 Left ?09/18/2021 Right ?10/27/2021 Left ?10/27/2021  ?Shoulder flexion 5/5 5/5    ?Shoulder abduction 5/5 5/5    ?Middle trapezius 4/5 4/5 4/5 4/5  ?Lower trapezius 3/5 3/5 4/5 4/5  ?Latissimus dorsi 4/5 4/5 4/5 4/5  ?Elbow flexion 5/5 5/5    ?Elbow extension 5/5 5/5    ?Wrist flexion 5/5 5/5    ?Wrist extension 5/5 5/5    ?Grip strength        ? (Blank rows = not tested) ? ? ?[facility] Adult PT Treatment:  DATE: 10/27/2021 ?Therapeutic Exercise: ?Bird dogs with chin tuck 2x8 BIL ?Cat cows 2x10 ?Seated low rows with chin tuck 2x10 with 35# ?Manual Therapy: ?Supine suboccipital release/ effleurage to posterior cervical musculature ?Supine cervical rotation contract/ counter-contract MET x2 BIL with 1-min hold at end range ?Supine cervical rotation with light manual distraction x2 min BIL ?Neuromuscular re-ed: ?N/A ?Therapeutic Activity: ?N/A ?Modalities: ?N/A ?Self Care: ?N/A ? ? ?[place] Adult PT Treatment:                                                DATE: 10/22/2021 ?Therapeutic Exercise: ?Seated low rows with 25# cable 2x10 ?Seated high rows with 25# cable 2x10 ?Seated lat pull-downs with 25# cable 2x10 ?Seated shoulder rolls 2x10 forward and  backward ?Prone Y with 1# dumbbells 3x10 ?Cat cows 3x10 ?Seated cervical isotonic contractions against handhold resistance x10 BIL ?Manual Therapy: ?Supine cervical distraction x2 minutes ?Supine cervical retraction with Rt rotation x1 minute ?Prone Rt grade 3 upglides to C3-C5 x6 minutes ?Prone effleurage to BIL UT/ MT x8 minutes ?Neuromuscular re-ed: ?N/A ?Therapeutic Activity: ?N/A ?Modalities: ?N/A ?Self Care: ?N/A ? ? ?[place] Adult PT Treatment:                                                DATE: 10/14/2021 ?Therapeutic Exercise: ?Seated low rows with 20# cable 2x10 ?Seated high rows with 20# cable 2x10 ?Seated lat pull-downs with 20# cable 2x10 ?Seated shoulder rolls 2x10 forward and backward ?Prone swimmers with chin tuck hold and YTB between wrists 3x20 ?Cat cows 3x10 ?Seated PNF D2 shoulder flexion 2x10 BIL ?Manual Therapy: ?Manual cervical distraction x2 minutes ?Suboccipital release x2 minutes ?Effleurage to cervical paraspinals and upper trap x6 minutes ?Neuromuscular re-ed: ?N/A ?Therapeutic Activity: ?FOTO administration and education ?Modalities: ?N/A ?Self Care: ?N/A ? ? ? ?HOME EXERCISE PROGRAM: ?Access Code: [email or web address] ?URL: [email or web address] ?Date: 09/18/2021 ?Prepared by: Vanessa [place] ?  ?Exercises ?Seated Cervical Retraction - 1 x daily - 7 x weekly - 3 sets - 10 reps ?Standing Shoulder Row with Anchored Resistance - 1 x daily - 7 x weekly - 3 sets - 10 reps ?Seated Upper Trapezius Stretch - 1 x daily - 7 x weekly - 2 sets - 1-min hold ?Seated Levator Scapulae Stretch - 1 x daily - 7 x weekly - 2 sets - 1-min hold ?  ?  ?ASSESSMENT: ?  ?CLINICAL IMPRESSION: ?Due to pt arriving late, today's session was truncated. Upon re-assessment, the pt has made good improvement in her FOTO score and parascapular strength, although she remains limited in her cervical ROM. This was improved in-session with rotation contract/ counter-contract MET. She will continue to benefit from skilled PT  to address her primary impairments and return to her prior level of function with less limitation. ?  ?  ?OBJECTIVE IMPAIRMENTS decreased mobility, decreased ROM, decreased strength, hypomobility, impaired flexibility, postural dysfunction, and pain.  ?  ?ACTIVITY LIMITATIONS community activity, driving, and shopping.  ?  ?PERSONAL FACTORS 1-2 comorbidities: osteopenia  are also affecting patient's functional outcome.  ?  ?  ?REHAB POTENTIAL: Good ?  ?CLINICAL DECISION MAKING: Stable/uncomplicated ?  ?EVALUATION COMPLEXITY: Low ?  ?  ?GOALS: ?Goals reviewed with patient? Yes ?  ?SHORT TERM  GOALS: ?  ?STG Name Target Date Goal status  ?1 The pt will report understanding and adherence to her HEP in order to promote independence in the management of her primary impairments. ?Baseline: HEP provided at eval ?10/27/2021: Pt reports HEP adherence 10/16/2021 ACHIEVED  ?  ?LONG TERM GOALS:  ?  ?LTG Name Target Date Goal status  ?1 Pt will achieve BIL cervical rotation AROM of 70 degrees with 0-2/10 pain in order to drive without limitation. ?Baseline: 60 degrees BIL with 6/10 pain 11/13/2021 INITIAL  ?2 Pt will achieve a FOTO score of 61% in order to demonstrate improved functional ability as it relates to her primary impairments. ?Baseline: 47% at visit 4 ?10/27/2021: 59% 11/13/2021 PROGRESSING  ?3 Pt will achieve BIL global parascapular strength of 4+/5 in order to promote long-term postural improvement in the prophylactic care of future mechanical neck pain. ?Baseline: See MMT chart ?10/27/2021: See updated MET chart 11/13/2021 PROGRESSING  ?4 Pt will report ability to wear scarfs with 0/10 pain in order to dress for the cold weather. ?Baseline: Unable to wear scarfs >30 minutes due to increased neck pain 11/13/2021 INITIAL  ?  ?PLAN: ?PT FREQUENCY: 1x/week ?  ?PT DURATION: 8 weeks ?  ?PLANNED INTERVENTIONS: Therapeutic exercises, Therapeutic activity, Neuro Muscular re-education, Patient/Family education, Joint mobilization, Dry  Needling, Spinal mobilization, Cryotherapy, Moist heat, Taping, Traction, and Manual therapy ?  ?PLAN FOR NEXT SESSION: progress DNF/ parascapular strengthening/ mobility, manual techniques for pain modulation ? ? ?

## 2021-10-27 ENCOUNTER — Ambulatory Visit: Payer: Medicare Other | Attending: Geriatric Medicine

## 2021-10-27 DIAGNOSIS — M542 Cervicalgia: Secondary | ICD-10-CM | POA: Diagnosis not present

## 2021-10-27 DIAGNOSIS — M6281 Muscle weakness (generalized): Secondary | ICD-10-CM | POA: Insufficient documentation

## 2021-11-04 ENCOUNTER — Ambulatory Visit: Payer: Medicare Other

## 2021-11-04 DIAGNOSIS — M6281 Muscle weakness (generalized): Secondary | ICD-10-CM | POA: Diagnosis not present

## 2021-11-04 DIAGNOSIS — M542 Cervicalgia: Secondary | ICD-10-CM

## 2021-11-04 NOTE — Therapy (Signed)
?OUTPATIENT PHYSICAL THERAPY TREATMENT NOTE ? ? ?Patient Name: Cassandra Morrison ?MRN: [id number] ?DOB:Oct 31, 1940, 81 y.o., female ?Today's Date: 11/04/2021 ? ?PCP: Lajean Manes, MD ?REFERRING PROVIDER: Lajean Manes, MD ? ? PT End of Session - 11/04/21 1224   ? ? Visit Number 7   ? Number of Visits 9   ? Date for PT Re-Evaluation 11/13/21   ? Authorization Type MCR   ? Authorization Time Period FOTO v6, v10   ? Progress Note Due on Visit 10   ? PT Start Time 1225   Pt arrived 8 minutes late to her appointment.  ? PT Stop Time 1300   ? PT Time Calculation (min) 35 min   ? Activity Tolerance Patient tolerated treatment well   ? Behavior During Therapy [facility] for tasks assessed/performed   ? ?  ?  ? ?  ? ? ? ? ? ? ?Past Medical History:  ?Diagnosis Date  ? Complication of anesthesia   ? nausea   ? CPAP (continuous positive airway pressure) dependence   ? Depression   ? Diverticulitis   ? GERD (gastroesophageal reflux disease)   ? Hypercholesterolemia   ? Nephrolithiasis   ? OAB (overactive bladder)   ? Obesity   ? Osteopenia   ? Sciatica   ? Sleep apnea   ? ?Past Surgical History:  ?Procedure Laterality Date  ? COLONOSCOPY WITH PROPOFOL N/A 05/20/2015  ? Procedure: COLONOSCOPY WITH PROPOFOL;  Surgeon: Garlan Fair, MD;  Location: [facility];  Service: Endoscopy;  Laterality: N/A;  ? ?Patient Active Problem List  ? Diagnosis Date Noted  ? Spider veins 05/29/2015  ? Paroxysmal supraventricular tachycardia ([place]) 01/29/2014  ? Chest tightness 01/29/2014  ? 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 OSA on CPAP 06/09/2013  ? Obesity, unspecified 06/09/2013  ? ? ?REFERRING DIAG: M54.2 (ICD-10-CM) - Cervicalgia ? ?THERAPY DIAG:  ?Neck pain ? ?Muscle weakness (generalized) ? ?PERTINENT HISTORY: osteopenia ? ?PRECAUTIONS/RESTRICTIONS:  ? ?none ? ?SUBJECTIVE:  ?Pt reports that since last visit, she has been feeling much better. She also reports no problem with turning her head. ? ?PAIN:  ?Are you having pain? Yes ?NPRS  scale: 0/10 ?Pain location: Posterior neck ?PAIN TYPE: aching ?Pain description: intermittent  ?Aggravating factors: Turning head, wearing scarf ?Relieving factors: pain medication ? ?OBJECTIVE: ?*Unless otherwise noted, objective information collected previously* ? ?FOTO: 47%, pedicted 61% in 11 visits ?10/27/2021: 59% ? ?CERVICAL AROM ?  ?AROM AROM (deg) ?09/18/2021 AROM ?(Deg) ?10/27/2021 AROM ?(Deg) ?11/04/2021  ?Flexion 70    ?Extension 70    ?Right lateral flexion 30, stretch in Lt upper trap 25   ?Left lateral flexion 40 25   ?Right rotation 60p! 60p!, 68 with mild pain following MET 72 with 6-7/10 pain  ?Left rotation 60p! 60p!, 70p! Following MET 70 with 5/10 pain  ? (Blank rows = not tested) ?  ?  ?UE MMT: ?  ?MMT Right ?09/18/2021 Left ?09/18/2021 Right ?10/27/2021 Left ?10/27/2021  ?Shoulder flexion 5/5 5/5    ?Shoulder abduction 5/5 5/5    ?Middle trapezius 4/5 4/5 4/5 4/5  ?Lower trapezius 3/5 3/5 4/5 4/5  ?Latissimus dorsi 4/5 4/5 4/5 4/5  ?Elbow flexion 5/5 5/5    ?Elbow extension 5/5 5/5    ?Wrist flexion 5/5 5/5    ?Wrist extension 5/5 5/5    ?Grip strength        ? (Blank rows = not tested) ? ? ?[facility] Adult PT Treatment:  DATE: 11/04/2021 ?Therapeutic Exercise: ?Cat cow with chin tuck hold 2x10 ?Bird dogs with chin tuck hold 2x10 BIL ?Seated low rows with chin tuck 2x10 with 35# ?Manual Therapy: ?Supine suboccipital release/ effleurage to posterior cervical musculature ?Supine cervical rotation contract/ counter-contract MET x2 BIL with 1-min hold at end range ?Supine cervical rotation with light manual distraction x2 min BIL ?Effleurage to BIL cervical paraspinals/ UT ?Neuromuscular re-ed: ?N/A ?Therapeutic Activity: ?N/A ?Modalities: ?N/A ?Self Care: ?N/A ? ? ?[place] Adult PT Treatment:                                                DATE: 10/27/2021 ?Therapeutic Exercise: ?Bird dogs with chin tuck 2x8 BIL ?Cat cows 2x10 ?Seated low rows with chin tuck 2x10 with  35# ?Manual Therapy: ?Supine suboccipital release/ effleurage to posterior cervical musculature ?Supine cervical rotation contract/ counter-contract MET x2 BIL with 1-min hold at end range ?Supine cervical rotation with light manual distraction x2 min BIL ?Neuromuscular re-ed: ?N/A ?Therapeutic Activity: ?N/A ?Modalities: ?N/A ?Self Care: ?N/A ? ? ?[facility] Adult PT Treatment:                                                DATE: 10/22/2021 ?Therapeutic Exercise: ?Seated low rows with 25# cable 2x10 ?Seated high rows with 25# cable 2x10 ?Seated lat pull-downs with 25# cable 2x10 ?Seated shoulder rolls 2x10 forward and backward ?Prone Y with 1# dumbbells 3x10 ?Cat cows 3x10 ?Seated cervical isotonic contractions against handhold resistance x10 BIL ?Manual Therapy: ?Supine cervical distraction x2 minutes ?Supine cervical retraction with Rt rotation x1 minute ?Prone Rt grade 3 upglides to C3-C5 x6 minutes ?Prone effleurage to BIL UT/ MT x8 minutes ?Neuromuscular re-ed: ?N/A ?Therapeutic Activity: ?N/A ?Modalities: ?N/A ?Self Care: ?N/A ? ? ? ?HOME EXERCISE PROGRAM: ?Access Code: [email or web address] ?URL: [email or web address] ?Date: 09/18/2021 ?Prepared by: Vanessa Bath ?  ?Exercises ?Seated Cervical Retraction - 1 x daily - 7 x weekly - 3 sets - 10 reps ?Standing Shoulder Row with Anchored Resistance - 1 x daily - 7 x weekly - 3 sets - 10 reps ?Seated Upper Trapezius Stretch - 1 x daily - 7 x weekly - 2 sets - 1-min hold ?Seated Levator Scapulae Stretch - 1 x daily - 7 x weekly - 2 sets - 1-min hold ?  ? ?Added 11/04/2021:  ?- Seated Assisted Cervical Rotation with Towel  - 1 x daily - 7 x weekly - 10 reps - 5-sec hold ?- Cat Cow  - 1 x daily - 7 x weekly - 3 sets - 10 reps ?  ?ASSESSMENT: ?  ?CLINICAL IMPRESSION: ?Due to pt arriving late to her appointment, the session was truncated today. Pt responded well to all interventions, demonstrating good form and no pain with performed exercises today. Upon re-assessment  of cervical rotation AROM, the pt has made good progress, although these movements remain painful. She will continue to benefit from skilled PT to address her primary impairments and return to her prior level of function with less limitation. ?  ?  ?OBJECTIVE IMPAIRMENTS decreased mobility, decreased ROM, decreased strength, hypomobility, impaired flexibility, postural dysfunction, and pain.  ?  ?ACTIVITY LIMITATIONS community activity, driving, and shopping.  ?  ?PERSONAL FACTORS 1-2 comorbidities:  osteopenia  are also affecting patient's functional outcome.  ?  ?  ?REHAB POTENTIAL: Good ?  ?CLINICAL DECISION MAKING: Stable/uncomplicated ?  ?EVALUATION COMPLEXITY: Low ?  ?  ?GOALS: ?Goals reviewed with patient? Yes ?  ?SHORT TERM GOALS: ?  ?STG Name Target Date Goal status  ?1 The pt will report understanding and adherence to her HEP in order to promote independence in the management of her primary impairments. ?Baseline: HEP provided at eval ?10/27/2021: Pt reports HEP adherence 10/16/2021 ACHIEVED  ?  ?LONG TERM GOALS:  ?  ?LTG Name Target Date Goal status  ?1 Pt will achieve BIL cervical rotation AROM of 70 degrees with 0-2/10 pain in order to drive without limitation. ?Baseline: 60 degrees BIL with 6/10 pain 11/13/2021 INITIAL  ?2 Pt will achieve a FOTO score of 61% in order to demonstrate improved functional ability as it relates to her primary impairments. ?Baseline: 47% at visit 4 ?10/27/2021: 59% 11/13/2021 PROGRESSING  ?3 Pt will achieve BIL global parascapular strength of 4+/5 in order to promote long-term postural improvement in the prophylactic care of future mechanical neck pain. ?Baseline: See MMT chart ?10/27/2021: See updated MET chart 11/13/2021 PROGRESSING  ?4 Pt will report ability to wear scarfs with 0/10 pain in order to dress for the cold weather. ?Baseline: Unable to wear scarfs >30 minutes due to increased neck pain 11/13/2021 INITIAL  ?  ?PLAN: ?PT FREQUENCY: 1x/week ?  ?PT DURATION: 8 weeks ?   ?PLANNED INTERVENTIONS: Therapeutic exercises, Therapeutic activity, Neuro Muscular re-education, Patient/Family education, Joint mobilization, Dry Needling, Spinal mobilization, Cryotherapy, Moist heat, Taping, Tracti

## 2021-11-11 ENCOUNTER — Ambulatory Visit: Payer: Medicare Other

## 2021-11-11 DIAGNOSIS — M6281 Muscle weakness (generalized): Secondary | ICD-10-CM

## 2021-11-11 DIAGNOSIS — M542 Cervicalgia: Secondary | ICD-10-CM | POA: Diagnosis not present

## 2021-11-11 NOTE — Therapy (Signed)
?OUTPATIENT PHYSICAL THERAPY TREATMENT NOTE/ RE-CERTIFICATION ? ? ?Patient Name: Cassandra Morrison ?MRN: [id number] ?DOB:1941-07-16, 81 y.o., female ?Today's Date: 11/11/2021 ? ?PCP: Lajean Manes, MD ?REFERRING PROVIDER: Lajean Manes, MD ? ? PT End of Session - 11/11/21 1220   ? ? Visit Number 8   ? Number of Visits 16   ? Date for PT Re-Evaluation 01/06/22   ? Authorization Type MCR   ? Authorization Time Period FOTO v6, v10, kx modifier v15   ? Progress Note Due on Visit 10   ? PT Start Time 1215   ? PT Stop Time 1062   ? PT Time Calculation (min) 40 min   ? Activity Tolerance Patient tolerated treatment well   ? Behavior During Therapy [facility] for tasks assessed/performed   ? ?  ?  ? ?  ? ? ? ? ? ? ? ?Past Medical History:  ?Diagnosis Date  ? Complication of anesthesia   ? nausea   ? CPAP (continuous positive airway pressure) dependence   ? Depression   ? Diverticulitis   ? GERD (gastroesophageal reflux disease)   ? Hypercholesterolemia   ? Nephrolithiasis   ? OAB (overactive bladder)   ? Obesity   ? Osteopenia   ? Sciatica   ? Sleep apnea   ? ?Past Surgical History:  ?Procedure Laterality Date  ? COLONOSCOPY WITH PROPOFOL N/A 05/20/2015  ? Procedure: COLONOSCOPY WITH PROPOFOL;  Surgeon: Garlan Fair, MD;  Location: [facility];  Service: Endoscopy;  Laterality: N/A;  ? ?Patient Active Problem List  ? Diagnosis Date Noted  ? Spider veins 05/29/2015  ? Paroxysmal supraventricular tachycardia ([place]) 01/29/2014  ? Chest tightness 01/29/2014  ? 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 OSA on CPAP 06/09/2013  ? Obesity, unspecified 06/09/2013  ? ? ?REFERRING DIAG: M54.2 (ICD-10-CM) - Cervicalgia ? ?THERAPY DIAG:  ?Neck pain ? ?Muscle weakness (generalized) ? ?PERTINENT HISTORY: osteopenia ? ?PRECAUTIONS/RESTRICTIONS:  ? ?none ? ?SUBJECTIVE:  ?Pt reports no pain currently, but states she has 3-4/10 pain when turning her head. She reports varied adherence to her HEP. ? ?PAIN:  ?Are you having pain? Yes ?NPRS scale:  0/10 ?Pain location: Posterior neck ?PAIN TYPE: aching ?Pain description: intermittent  ?Aggravating factors: Turning head, wearing scarf ?Relieving factors: pain medication ? ?OBJECTIVE: ?*Unless otherwise noted, objective information collected previously* ? ?FOTO: 47%, pedicted 61% in 11 visits ?10/27/2021: 59% ? ?CERVICAL AROM ?  ?AROM AROM (deg) ?09/18/2021 AROM ?(Deg) ?10/27/2021 AROM ?(Deg) ?11/04/2021  ?Flexion 70    ?Extension 70    ?Right lateral flexion 30, stretch in Lt upper trap 25   ?Left lateral flexion 40 25   ?Right rotation 60p! 60p!, 68 with mild pain following MET 72 with 6-7/10 pain  ?Left rotation 60p! 60p!, 70p! Following MET 70 with 5/10 pain  ? (Blank rows = not tested) ?  ?  ?UE MMT: ?  ?MMT Right ?09/18/2021 Left ?09/18/2021 Right ?10/27/2021 Left ?10/27/2021 Right ?11/11/2021 Left ?11/11/2021  ?Shoulder flexion 5/5 5/5      ?Shoulder abduction 5/5 5/5      ?Middle trapezius 4/5 4/5 4/5 4/5 5/5 4+/5  ?Lower trapezius 3/5 3/5 4/5 4/5 4/5 4/5  ?Latissimus dorsi 4/5 4/5 4/5 4/5 5/5 5/5  ?Elbow flexion 5/5 5/5      ?Elbow extension 5/5 5/5      ?Wrist flexion 5/5 5/5      ?Wrist extension 5/5 5/5      ?Grip strength          ? (  Blank rows = not tested) ? ? ?[facility] Adult PT Treatment:                                                DATE: 11/11/2021 ?Therapeutic Exercise: ?Standing BIL shoulder extension with 3# cables 3x10 ?Standing cervical rotation isotonic contraction 2x5 BIL ?Standing low rows with 10# cables 2x10 ?Standing shoulder rolls 2x10 forward and backward ?Manual Therapy: ?Supine suboccipital release/ effleurage to posterior cervical musculature ?Supine cervical rotation with light manual distraction x2 min BIL ?Neuromuscular re-ed: ?N/A ?Therapeutic Activity: ?N/A ?Modalities: ?N/A ?Self Care: ?N/A ? ? ?[facility] Adult PT Treatment:                                                DATE: 11/04/2021 ?Therapeutic Exercise: ?Cat cow with chin tuck hold 2x10 ?Bird dogs with chin tuck hold 2x10 BIL ?Seated low  rows with chin tuck 2x10 with 35# ?Manual Therapy: ?Supine suboccipital release/ effleurage to posterior cervical musculature ?Supine cervical rotation contract/ counter-contract MET x2 BIL with 1-min hold at end range ?Supine cervical rotation with light manual distraction x2 min BIL ?Effleurage to BIL cervical paraspinals/ UT ?Neuromuscular re-ed: ?N/A ?Therapeutic Activity: ?N/A ?Modalities: ?N/A ?Self Care: ?N/A ? ? ?[facility] Adult PT Treatment:                                                DATE: 10/27/2021 ?Therapeutic Exercise: ?Bird dogs with chin tuck 2x8 BIL ?Cat cows 2x10 ?Seated low rows with chin tuck 2x10 with 35# ?Manual Therapy: ?Supine suboccipital release/ effleurage to posterior cervical musculature ?Supine cervical rotation contract/ counter-contract MET x2 BIL with 1-min hold at end range ?Supine cervical rotation with light manual distraction x2 min BIL ?Neuromuscular re-ed: ?N/A ?Therapeutic Activity: ?N/A ?Modalities: ?N/A ?Self Care: ?N/A ? ? ? ? ? ?HOME EXERCISE PROGRAM: ?Access Code: [email or web address] ?URL: [email or web address] ?Date: 09/18/2021 ?Prepared by: Vanessa Amagon ?  ?Exercises ?Seated Cervical Retraction - 1 x daily - 7 x weekly - 3 sets - 10 reps ?Standing Shoulder Row with Anchored Resistance - 1 x daily - 7 x weekly - 3 sets - 10 reps ?Seated Upper Trapezius Stretch - 1 x daily - 7 x weekly - 2 sets - 1-min hold ?Seated Levator Scapulae Stretch - 1 x daily - 7 x weekly - 2 sets - 1-min hold ?  ? ?Added 11/04/2021:  ?- Seated Assisted Cervical Rotation with Towel  - 1 x daily - 7 x weekly - 10 reps - 5-sec hold ?- Cat Cow  - 1 x daily - 7 x weekly - 3 sets - 10 reps ?  ?ASSESSMENT: ?  ?CLINICAL IMPRESSION: ?Pt responded well to all interventions today, demonstrating good form with exercises and improved pain sxs with manual techniques. Upon re-assessment of parascapular strength, the pt continues to show improvement. To date, the pt has made improvements in cervical ROM,  strength, pain, and functional ability, although she remains limited by pain with cervical rotation. She will continue to benefit from skilled PT to address her primary impairments and return to her prior level of function  with less limitation. ?  ?  ?OBJECTIVE IMPAIRMENTS decreased mobility, decreased ROM, decreased strength, hypomobility, impaired flexibility, postural dysfunction, and pain.  ?  ?ACTIVITY LIMITATIONS community activity, driving, and shopping.  ?  ?PERSONAL FACTORS 1-2 comorbidities: osteopenia  are also affecting patient's functional outcome.  ?  ?  ?REHAB POTENTIAL: Good ?  ?CLINICAL DECISION MAKING: Stable/uncomplicated ?  ?EVALUATION COMPLEXITY: Low ?  ?  ?GOALS: ?Goals reviewed with patient? Yes ?  ?SHORT TERM GOALS: ?  ?STG Name Target Date Goal status  ?1 The pt will report understanding and adherence to her HEP in order to promote independence in the management of her primary impairments. ?Baseline: HEP provided at eval ?10/27/2021: Pt reports HEP adherence 10/16/2021 ACHIEVED  ?  ?LONG TERM GOALS:  ?  ?LTG Name Target Date Goal status  ?1 Pt will achieve BIL cervical rotation AROM of 70 degrees with 0-2/10 pain in order to drive without limitation. ?Baseline: 60 degrees BIL with 6/10 pain ?11/04/2021: Achieved 11/13/2021 ACHIEVED  ?2 Pt will achieve a FOTO score of 61% in order to demonstrate improved functional ability as it relates to her primary impairments. ?Baseline: 47% at visit 4 ?10/27/2021: 59% 11/13/2021 PROGRESSING  ?3 Pt will achieve BIL global parascapular strength of 4+/5 in order to promote long-term postural improvement in the prophylactic care of future mechanical neck pain. ?Baseline: See MMT chart ?10/27/2021: See updated MMT chart ?11/11/2021: See updated MMT chart 11/13/2021 PROGRESSING  ?4 Pt will report ability to wear scarfs with 0/10 pain in order to dress for the cold weather. ?Baseline: Unable to wear scarfs >30 minutes due to increased neck pain ?11/11/2021: Pt reports no  need to wear scarfs at this time. 11/13/2021 DISCONTINUED  ?  ?PLAN: ?PT FREQUENCY: 1x/week ?  ?PT DURATION: 8 weeks ?  ?PLANNED INTERVENTIONS: Therapeutic exercises, Therapeutic activity, Neuro Muscular re-e

## 2021-11-19 DIAGNOSIS — L84 Corns and callosities: Secondary | ICD-10-CM | POA: Diagnosis not present

## 2021-11-19 DIAGNOSIS — M2041 Other hammer toe(s) (acquired), right foot: Secondary | ICD-10-CM | POA: Diagnosis not present

## 2021-11-19 NOTE — Therapy (Signed)
?OUTPATIENT PHYSICAL THERAPY TREATMENT NOTE/ RE-CERTIFICATION ? ? ?Patient Name: Cassandra Morrison ?MRN: [id number] ?DOB:[birth year], 81 y.o., female ?Today's Date: 11/20/2021 ? ?PCP: Lajean Manes, MD ?REFERRING PROVIDER: Lajean Manes, MD ? ? PT End of Session - 11/20/21 1616   ? ? Visit Number 9   ? Number of Visits 16   ? Date for PT Re-Evaluation 01/06/22   ? Authorization Type MCR   ? Authorization Time Period FOTO v6, v10, kx modifier v15   ? Progress Note Due on Visit 10   ? PT Start Time 1616   ? PT Stop Time 3875   ? PT Time Calculation (min) 42 min   ? Activity Tolerance Patient tolerated treatment well   ? Behavior During Therapy [facility] for tasks assessed/performed   ? ?  ?  ? ?  ? ? ? ? ? ? ? ? ?Past Medical History:  ?Diagnosis Date  ? Complication of anesthesia   ? nausea   ? CPAP (continuous positive airway pressure) dependence   ? Depression   ? Diverticulitis   ? GERD (gastroesophageal reflux disease)   ? Hypercholesterolemia   ? Nephrolithiasis   ? OAB (overactive bladder)   ? Obesity   ? Osteopenia   ? Sciatica   ? Sleep apnea   ? ?Past Surgical History:  ?Procedure Laterality Date  ? COLONOSCOPY WITH PROPOFOL N/A 05/20/2015  ? Procedure: COLONOSCOPY WITH PROPOFOL;  Surgeon: Garlan Fair, MD;  Location: [facility];  Service: Endoscopy;  Laterality: N/A;  ? ?Patient Active Problem List  ? Diagnosis Date Noted  ? Spider veins 05/29/2015  ? Paroxysmal supraventricular tachycardia ([place]) 01/29/2014  ? Chest tightness 01/29/2014  ? 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 OSA on CPAP 06/09/2013  ? Obesity, unspecified 06/09/2013  ? ? ?REFERRING DIAG: M54.2 (ICD-10-CM) - Cervicalgia ? ?THERAPY DIAG:  ?Neck pain ? ?Muscle weakness (generalized) ? ?PERTINENT HISTORY: osteopenia ? ?PRECAUTIONS/RESTRICTIONS:  ? ?none ? ?SUBJECTIVE:  ?Pt reports increased LBP today since about Tuesday that she can't attribute to any particular mechanism. She reports that she has been on the road every day since last  Thursday, and this has also increased neck pain. She continues to have pain with end-range rotation. She reports no neck pain at rest. ? ?PAIN:  ?Are you having pain? Yes ?NPRS scale: 0/10 ?Pain location: Posterior neck ?PAIN TYPE: aching ?Pain description: intermittent  ?Aggravating factors: Turning head, wearing scarf ?Relieving factors: pain medication ? ?OBJECTIVE: ?*Unless otherwise noted, objective information collected previously* ? ?FOTO: 47%, pedicted 61% in 11 visits ?10/27/2021: 59% ? ?CERVICAL AROM ?  ?AROM AROM (deg) ?09/18/2021 AROM ?(Deg) ?10/27/2021 AROM ?(Deg) ?11/04/2021  ?Flexion 70    ?Extension 70    ?Right lateral flexion 30, stretch in Lt upper trap 25   ?Left lateral flexion 40 25   ?Right rotation 60p! 60p!, 68 with mild pain following MET 72 with 6-7/10 pain  ?Left rotation 60p! 60p!, 70p! Following MET 70 with 5/10 pain  ? (Blank rows = not tested) ?  ?  ?UE MMT: ?  ?MMT Right ?09/18/2021 Left ?09/18/2021 Right ?10/27/2021 Left ?10/27/2021 Right ?11/11/2021 Left ?11/11/2021  ?Shoulder flexion 5/5 5/5      ?Shoulder abduction 5/5 5/5      ?Middle trapezius 4/5 4/5 4/5 4/5 5/5 4+/5  ?Lower trapezius 3/5 3/5 4/5 4/5 4/5 4/5  ?Latissimus dorsi 4/5 4/5 4/5 4/5 5/5 5/5  ?Elbow flexion 5/5 5/5      ?Elbow extension 5/5 5/5      ?  Wrist flexion 5/5 5/5      ?Wrist extension 5/5 5/5      ?Grip strength          ? (Blank rows = not tested) ? ? ?[facility] Adult PT Treatment:                                                DATE: 11/19/2021 ?Therapeutic Exercise: ?Sidelying lumbar open book x10 BIL ?Prone knee plank with chin tuck hold 3x30sec ?Prone Y lift-off with chin tuck 2x8 ?Sidelying cervical side bend 2x10 BIL ?Manual Therapy: ?Supine suboccipital release/ effleurage to posterior cervical musculature ?Seated UT pin and stretch x11mn BIL ?Effleurage to BIL cervical paraspinals/ UT ?Neuromuscular re-ed: ?N/A ?Therapeutic Activity: ?N/A ?Modalities: ?N/A ?Self Care: ?N/A ? ? ?[facility]:                                                 DATE: 11/11/2021 ?Therapeutic Exercise: ?Standing BIL shoulder extension with 3# cables 3x10 ?Standing cervical rotation isotonic contraction 2x5 BIL ?Standing low rows with 10# cables 2x10 ?Standing shoulder rolls 2x10 forward and backward ?Manual Therapy: ?Supine suboccipital release/ effleurage to posterior cervical musculature ?Supine cervical rotation with light manual distraction x2 min BIL ?Neuromuscular re-ed: ?N/A ?Therapeutic Activity: ?N/A ?Modalities: ?N/A ?Self Care: ?N/A ? ? ?[facility]:                                                DATE: 11/04/2021 ?Therapeutic Exercise: ?Cat cow with chin tuck hold 2x10 ?Bird dogs with chin tuck hold 2x10 BIL ?Seated low rows with chin tuck 2x10 with 35# ?Manual Therapy: ?Supine suboccipital release/ effleurage to posterior cervical musculature ?Supine cervical rotation contract/ counter-contract MET x2 BIL with 1-min hold at end range ?Supine cervical rotation with light manual distraction x2 min BIL ?Effleurage to BIL cervical paraspinals/ UT ?Neuromuscular re-ed: ?N/A ?Therapeutic Activity: ?N/A ?Modalities: ?N/A ?Self Care: ?N/A ? ? ? ? ? ? ?HOME EXERCISE PROGRAM: ?Access Code: [email or web address]?URL: [email or web address] ?Date: 09/18/2021 ?Prepared by: TVanessa [place]?  ?Exercises ?Seated Cervical Retraction - 1 x daily - 7 x weekly - 3 sets - 10 reps ?Standing Shoulder Row with Anchored Resistance - 1 x daily - 7 x weekly - 3 sets - 10 reps ?Seated Upper Trapezius Stretch - 1 x daily - 7 x weekly - 2 sets - 1-min hold ?Seated Levator Scapulae Stretch - 1 x daily - 7 x weekly - 2 sets - 1-min hold ?  ? ?Added 11/04/2021:  ?- Seated Assisted Cervical Rotation with Towel  - 1 x daily - 7 x weekly - 10 reps - 5-sec hold ?- Cat Cow  - 1 x daily - 7 x weekly - 3 sets - 10 reps ? ?Added 11/20/2021:  ?- Plank on Knees  - 1 x daily - 7 x weekly - 3 sets - 30-sec hold ?- Sidelying Open Book Thoracic Lumbar Rotation and  Extension  - 1 x daily - 7 x weekly - 2 sets - 10 reps ?  ?ASSESSMENT: ?  ?CLINICAL IMPRESSION: ?  Pt responded well to all interventions today, demonstrating good form and no increase in pain with performed exercises. Due to acute lumbar strain, pt provided low-level core strengthening and lumbar ROM exercises. She reports a therapeutic response to open books, as well as continued therapeutic response to cervical manual techniques. She will continue to benefit from skilled PT to address her primary impairments and return to her prior level of function with less limitation. ?  ?  ?OBJECTIVE IMPAIRMENTS decreased mobility, decreased ROM, decreased strength, hypomobility, impaired flexibility, postural dysfunction, and pain.  ?  ?ACTIVITY LIMITATIONS community activity, driving, and shopping.  ?  ?PERSONAL FACTORS 1-2 comorbidities: osteopenia  are also affecting patient's functional outcome.  ?  ?  ?REHAB POTENTIAL: Good ?  ?CLINICAL DECISION MAKING: Stable/uncomplicated ?  ?EVALUATION COMPLEXITY: Low ?  ?  ?GOALS: ?Goals reviewed with patient? Yes ?  ?SHORT TERM GOALS: ?  ?STG Name Target Date Goal status  ?1 The pt will report understanding and adherence to her HEP in order to promote independence in the management of her primary impairments. ?Baseline: HEP provided at eval ?10/27/2021: Pt reports HEP adherence 10/16/2021 ACHIEVED  ?  ?LONG TERM GOALS:  ?  ?LTG Name Target Date Goal status  ?1 Pt will achieve BIL cervical rotation AROM of 70 degrees with 0-2/10 pain in order to drive without limitation. ?Baseline: 60 degrees BIL with 6/10 pain ?11/04/2021: Achieved 11/13/2021 ACHIEVED  ?2 Pt will achieve a FOTO score of 61% in order to demonstrate improved functional ability as it relates to her primary impairments. ?Baseline: 47% at visit 4 ?10/27/2021: 59% 11/13/2021 PROGRESSING  ?3 Pt will achieve BIL global parascapular strength of 4+/5 in order to promote long-term postural improvement in the prophylactic care of future  mechanical neck pain. ?Baseline: See MMT chart ?10/27/2021: See updated MMT chart ?11/11/2021: See updated MMT chart 11/13/2021 PROGRESSING  ?4 Pt will report ability to wear scarfs with 0/10 pain in order to dre

## 2021-11-20 ENCOUNTER — Ambulatory Visit: Payer: Medicare Other

## 2021-11-20 DIAGNOSIS — M542 Cervicalgia: Secondary | ICD-10-CM

## 2021-11-20 DIAGNOSIS — M6281 Muscle weakness (generalized): Secondary | ICD-10-CM

## 2021-11-20 DIAGNOSIS — N3281 Overactive bladder: Secondary | ICD-10-CM | POA: Diagnosis not present

## 2021-11-21 DIAGNOSIS — H353132 Nonexudative age-related macular degeneration, bilateral, intermediate dry stage: Secondary | ICD-10-CM | POA: Diagnosis not present

## 2021-11-25 NOTE — Therapy (Signed)
?OUTPATIENT PHYSICAL THERAPY TREATMENT NOTE ? ? ?Patient Name: Cassandra Morrison ?MRN: [id number] ?DOB:08/13/1940, 81 y.o., female ?Today's Date: 11/26/2021 ? ?PCP: Lajean Manes, MD ?REFERRING PROVIDER: Lajean Manes, MD ? ? PT End of Session - 11/26/21 1219   ? ? Visit Number 10   ? Number of Visits 16   ? Date for PT Re-Evaluation 01/06/22   ? Authorization Type MCR   ? Authorization Time Period FOTO v6, v10, kx modifier v15   ? Progress Note Due on Visit 10   ? PT Start Time 1219   ? PT Stop Time 1300   3 minutes of needle insertion during TPDN  ? PT Time Calculation (min) 41 min   ? Activity Tolerance Patient tolerated treatment well   ? Behavior During Therapy [facility] for tasks assessed/performed   ? ?  ?  ? ?  ? ? ? ? ? ? ? ? ? ?Past Medical History:  ?Diagnosis Date  ? Complication of anesthesia   ? nausea   ? CPAP (continuous positive airway pressure) dependence   ? Depression   ? Diverticulitis   ? GERD (gastroesophageal reflux disease)   ? Hypercholesterolemia   ? Nephrolithiasis   ? OAB (overactive bladder)   ? Obesity   ? Osteopenia   ? Sciatica   ? Sleep apnea   ? ?Past Surgical History:  ?Procedure Laterality Date  ? COLONOSCOPY WITH PROPOFOL N/A 05/20/2015  ? Procedure: COLONOSCOPY WITH PROPOFOL;  Surgeon: Garlan Fair, MD;  Location: [facility];  Service: Endoscopy;  Laterality: N/A;  ? ?Patient Active Problem List  ? Diagnosis Date Noted  ? Spider veins 05/29/2015  ? Paroxysmal supraventricular tachycardia ([place]) 01/29/2014  ? Chest tightness 01/29/2014  ? 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 OSA on CPAP 06/09/2013  ? Obesity, unspecified 06/09/2013  ? ? ?REFERRING DIAG: M54.2 (ICD-10-CM) - Cervicalgia ? ?THERAPY DIAG:  ?Neck pain ? ?Muscle weakness (generalized) ? ?PERTINENT HISTORY: osteopenia ? ?PRECAUTIONS/RESTRICTIONS:  ? ?none ? ?SUBJECTIVE:  ?Pt reports continued difficulty with turning her head primarily to the left, such as when driving. She reports no pain currently, although she has  6/10 pain when turning her head to end-range. She also reports continued HEP adherence.  ? ?PAIN:  ?Are you having pain? Yes ?NPRS scale: 0/10 ?Pain location: Posterior neck ?PAIN TYPE: aching ?Pain description: intermittent  ?Aggravating factors: Turning head, wearing scarf ?Relieving factors: pain medication ? ?OBJECTIVE: ?*Unless otherwise noted, objective information collected previously* ? ?FOTO: 47%, pedicted 61% in 11 visits ?10/27/2021: 59% ?11/26/2021: 55% ? ?CERVICAL AROM ?  ?AROM AROM (deg) ?09/18/2021 AROM ?(Deg) ?10/27/2021 AROM ?(Deg) ?11/04/2021  ?Flexion 70    ?Extension 70    ?Right lateral flexion 30, stretch in Lt upper trap 25   ?Left lateral flexion 40 25   ?Right rotation 60p! 60p!, 68 with mild pain following MET 72 with 6-7/10 pain  ?Left rotation 60p! 60p!, 70p! Following MET 70 with 5/10 pain  ? (Blank rows = not tested) ?  ?  ?UE MMT: ?  ?MMT Right ?09/18/2021 Left ?09/18/2021 Right ?10/27/2021 Left ?10/27/2021 Right ?11/11/2021 Left ?11/11/2021  ?Shoulder flexion 5/5 5/5      ?Shoulder abduction 5/5 5/5      ?Middle trapezius 4/5 4/5 4/5 4/5 5/5 4+/5  ?Lower trapezius 3/5 3/5 4/5 4/5 4/5 4/5  ?Latissimus dorsi 4/5 4/5 4/5 4/5 5/5 5/5  ?Elbow flexion 5/5 5/5      ?Elbow extension 5/5 5/5      ?  Wrist flexion 5/5 5/5      ?Wrist extension 5/5 5/5      ?Grip strength          ? (Blank rows = not tested) ? ?FUNCTIONAL TESTS: ?11/26/2021: 25# kettlebell lift from waist to overhead: Unable to lift past chest level ? ?[facility] Adult PT Treatment:                                                DATE: 11/26/2021 ?Therapeutic Exercise: ?Prone swimmers with chin tuck hold 2x10 BIL ?Seated low rows with 25# 2x10 ?Seated high rows with 25# 2x10 ?Seated lat pull-downs with 25# 2x10 ?Seated shoulder rolls 2x10 forward and backward ?Manual Therapy: ?Skilled palpation to identify trigger points prior to TPDN ?Effleurage to BIL UT following TPDN ?Neuromuscular re-ed: ?N/A ?Therapeutic Activity: ?N/A ?Modalities: ?N/A ?Self  Care: ?N/A ? ?Trigger Point Dry-Needling  ?Treatment instructions: Expect mild to moderate muscle soreness. S/S of pneumothorax if dry needled over a lung field, and to seek immediate medical attention should they occur. Patient verbalized understanding of these instructions and education. ? ?Patient Consent Given: Yes ?Education handout provided: Yes ?Muscles treated: BIL upper traps ?Electrical stimulation performed: No ?Parameters: N/A ?Treatment response/outcome: Multiple twitch responses and improved muscle extensibility ? ? ? ?[facility] Adult PT Treatment:                                                DATE: 11/19/2021 ?Therapeutic Exercise: ?Sidelying lumbar open book x10 BIL ?Prone knee plank with chin tuck hold 3x30sec ?Prone Y lift-off with chin tuck 2x8 ?Sidelying cervical side bend 2x10 BIL ?Manual Therapy: ?Supine suboccipital release/ effleurage to posterior cervical musculature ?Seated UT pin and stretch x80mn BIL ?Effleurage to BIL cervical paraspinals/ UT ?Neuromuscular re-ed: ?N/A ?Therapeutic Activity: ?N/A ?Modalities: ?N/A ?Self Care: ?N/A ? ? ?[facility]                                                DATE: 11/11/2021 ?Therapeutic Exercise: ?Standing BIL shoulder extension with 3# cables 3x10 ?Standing cervical rotation isotonic contraction 2x5 BIL ?Standing low rows with 10# cables 2x10 ?Standing shoulder rolls 2x10 forward and backward ?Manual Therapy: ?Supine suboccipital release/ effleurage to posterior cervical musculature ?Supine cervical rotation with light manual distraction x2 min BIL ?Neuromuscular re-ed: ?N/A ?Therapeutic Activity: ?N/A ?Modalities: ?N/A ?Self Care: ?N/A ? ? ? ? ? ? ? ?HOME EXERCISE PROGRAM: ?Access Code: [email or web address]?URL: [email or web address] ?Date: 09/18/2021 ?Prepared by: TVanessa [place]?  ?Exercises ?Seated Cervical Retraction - 1 x daily - 7 x weekly - 3 sets - 10 reps ?Standing Shoulder Row with Anchored Resistance - 1 x daily - 7 x weekly  - 3 sets - 10 reps ?Seated Upper Trapezius Stretch - 1 x daily - 7 x weekly - 2 sets - 1-min hold ?Seated Levator Scapulae Stretch - 1 x daily - 7 x weekly - 2 sets - 1-min hold ?  ? ?Added 11/04/2021:  ?- Seated Assisted Cervical Rotation with Towel  - 1 x daily - 7 x weekly - 10 reps - 5-sec  hold ?- Cat Cow  - 1 x daily - 7 x weekly - 3 sets - 10 reps ? ?Added 11/20/2021:  ?- Plank on Knees  - 1 x daily - 7 x weekly - 3 sets - 30-sec hold ?- Sidelying Open Book Thoracic Lumbar Rotation and Extension  - 1 x daily - 7 x weekly - 2 sets - 10 reps ?  ?ASSESSMENT: ?  ?CLINICAL IMPRESSION: ?Pt responded well to all interventions today, demonstrating good form and no increase in pain with performed exercises. She also responded well to TPDN today with multiple twitch responses elicited and improved muscle extensibility following treatment. She will continue to benefit from skilled PT to address her primary impairments and return to her prior level of function with less limitation. ?  ?  ?OBJECTIVE IMPAIRMENTS decreased mobility, decreased ROM, decreased strength, hypomobility, impaired flexibility, postural dysfunction, and pain.  ?  ?ACTIVITY LIMITATIONS community activity, driving, and shopping.  ?  ?PERSONAL FACTORS 1-2 comorbidities: osteopenia  are also affecting patient's functional outcome.  ?  ?  ?REHAB POTENTIAL: Good ?  ?CLINICAL DECISION MAKING: Stable/uncomplicated ?  ?EVALUATION COMPLEXITY: Low ?  ?  ?GOALS: ?Goals reviewed with patient? Yes ?  ?SHORT TERM GOALS: ?  ?STG Name Target Date Goal status  ?1 The pt will report understanding and adherence to her HEP in order to promote independence in the management of her primary impairments. ?Baseline: HEP provided at eval ?10/27/2021: Pt reports HEP adherence 10/16/2021 ACHIEVED  ?  ?LONG TERM GOALS:  ?  ?LTG Name Target Date Goal status  ?1 Pt will achieve BIL cervical rotation AROM of 70 degrees with 0-2/10 pain in order to drive without limitation. ?Baseline: 60  degrees BIL with 6/10 pain ?11/04/2021: Achieved 11/13/2021 ACHIEVED  ?2 Pt will achieve a FOTO score of 61% in order to demonstrate improved functional ability as it relates to her primary impairments. ?[employer]

## 2021-11-26 ENCOUNTER — Ambulatory Visit: Payer: Medicare Other | Attending: Geriatric Medicine

## 2021-11-26 DIAGNOSIS — M6281 Muscle weakness (generalized): Secondary | ICD-10-CM | POA: Insufficient documentation

## 2021-11-26 DIAGNOSIS — M542 Cervicalgia: Secondary | ICD-10-CM | POA: Insufficient documentation

## 2021-11-26 NOTE — Patient Instructions (Signed)

## 2021-12-03 DIAGNOSIS — D225 Melanocytic nevi of trunk: Secondary | ICD-10-CM | POA: Diagnosis not present

## 2021-12-03 DIAGNOSIS — L814 Other melanin hyperpigmentation: Secondary | ICD-10-CM | POA: Diagnosis not present

## 2021-12-03 DIAGNOSIS — D692 Other nonthrombocytopenic purpura: Secondary | ICD-10-CM | POA: Diagnosis not present

## 2021-12-03 DIAGNOSIS — L821 Other seborrheic keratosis: Secondary | ICD-10-CM | POA: Diagnosis not present

## 2021-12-03 DIAGNOSIS — D1801 Hemangioma of skin and subcutaneous tissue: Secondary | ICD-10-CM | POA: Diagnosis not present

## 2021-12-05 NOTE — Therapy (Signed)
?OUTPATIENT PHYSICAL THERAPY TREATMENT NOTE ? ? ?Patient Name: Cassandra Morrison ?MRN: [id number] ?DOB:10-17-1940, 81 y.o., female ?Today's Date: 12/06/2021 ? ?PCP: Lajean Manes, MD ?REFERRING PROVIDER: Lajean Manes, MD ? ? PT End of Session - 12/06/21 0912   ? ? Visit Number 11   ? Number of Visits 16   ? Date for PT Re-Evaluation 01/06/22   ? Authorization Type MCR   ? Authorization Time Period FOTO v6, v10, kx modifier v15   ? Progress Note Due on Visit 10   ? PT Start Time 0908   ? PT Stop Time 0946   ? PT Time Calculation (min) 38 min   ? Activity Tolerance Patient tolerated treatment well   ? Behavior During Therapy [facility] for tasks assessed/performed   ? ?  ?  ? ?  ? ? ? ? ? ? ? ? ? ? ?Past Medical History:  ?Diagnosis Date  ? Complication of anesthesia   ? nausea   ? CPAP (continuous positive airway pressure) dependence   ? Depression   ? Diverticulitis   ? GERD (gastroesophageal reflux disease)   ? Hypercholesterolemia   ? Nephrolithiasis   ? OAB (overactive bladder)   ? Obesity   ? Osteopenia   ? Sciatica   ? Sleep apnea   ? ?Past Surgical History:  ?Procedure Laterality Date  ? COLONOSCOPY WITH PROPOFOL N/A 05/20/2015  ? Procedure: COLONOSCOPY WITH PROPOFOL;  Surgeon: Garlan Fair, MD;  Location: [facility];  Service: Endoscopy;  Laterality: N/A;  ? ?Patient Active Problem List  ? Diagnosis Date Noted  ? Spider veins 05/29/2015  ? Paroxysmal supraventricular tachycardia ([place]) 01/29/2014  ? Chest tightness 01/29/2014  ? 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 OSA on CPAP 06/09/2013  ? Obesity, unspecified 06/09/2013  ? ? ?REFERRING DIAG: M54.2 (ICD-10-CM) - Cervicalgia ? ?THERAPY DIAG:  ?Neck pain ? ?Muscle weakness (generalized) ? ?PERTINENT HISTORY: osteopenia ? ?PRECAUTIONS/RESTRICTIONS:  ? ?none ? ?SUBJECTIVE:  ?Pt reports that TPDN helped a lot, and she can turn her head much better today. Pt reports no pain currently, only 3/10 pain when turning her head to end range. She denies doing her HEP  due to travelling a lot this past week. ? ?PAIN:  ?Are you having pain? Yes ?NPRS scale: 0/10 ?Pain location: Posterior neck ?PAIN TYPE: aching ?Pain description: intermittent  ?Aggravating factors: Turning head, wearing scarf ?Relieving factors: pain medication ? ?OBJECTIVE: ?*Unless otherwise noted, objective information collected previously* ? ?FOTO: 47%, pedicted 61% in 11 visits ?10/27/2021: 59% ?11/26/2021: 55% ? ?CERVICAL AROM ?  ?AROM AROM (deg) ?09/18/2021 AROM ?(Deg) ?10/27/2021 AROM ?(Deg) ?11/04/2021  ?Flexion 70    ?Extension 70    ?Right lateral flexion 30, stretch in Lt upper trap 25   ?Left lateral flexion 40 25   ?Right rotation 60p! 60p!, 68 with mild pain following MET 72 with 6-7/10 pain  ?Left rotation 60p! 60p!, 70p! Following MET 70 with 5/10 pain  ? (Blank rows = not tested) ?  ?  ?UE MMT: ?  ?MMT Right ?09/18/2021 Left ?09/18/2021 Right ?10/27/2021 Left ?10/27/2021 Right ?11/11/2021 Left ?11/11/2021  ?Shoulder flexion 5/5 5/5      ?Shoulder abduction 5/5 5/5      ?Middle trapezius 4/5 4/5 4/5 4/5 5/5 4+/5  ?Lower trapezius 3/5 3/5 4/5 4/5 4/5 4/5  ?Latissimus dorsi 4/5 4/5 4/5 4/5 5/5 5/5  ?Elbow flexion 5/5 5/5      ?Elbow extension 5/5 5/5      ?Wrist flexion  5/5 5/5      ?Wrist extension 5/5 5/5      ?Grip strength          ? (Blank rows = not tested) ? ?FUNCTIONAL TESTS: ?11/26/2021: 25# kettlebell lift from waist to overhead: Unable to lift past chest level ? ? ?[facility] Adult PT Treatment:                                                DATE: 12/06/2021 ?Therapeutic Exercise: ?Seated chin tuck with 3# cable at FreeMotion 2x10 ?Seated cervical rotation with 3# cable at FreeMotion 2x10 BIL ?Seated cervical side bend with 3# cable at FreeMotion 2x10 BIL ?Seated cervical extension with 3# cable at FreeMotion 2x10 ?Standing shoulder rolls with 8# dumbbells 2x10 forward and backward ?Seated low rows with 30# 2x10 ?Seated high rows with 30# 2x10 ?Seated lat pull-downs with 25# 2x10 ?Seated UT stretch x2 min  BIL ?Manual Therapy: ?N/A ?Neuromuscular re-ed: ?N/A ?Therapeutic Activity: ?N/A ?Modalities: ?N/A ?Self Care: ?N/A ? ? ?[facility] Adult PT Treatment:                                                DATE: 11/26/2021 ?Therapeutic Exercise: ?Prone swimmers with chin tuck hold 2x10 BIL ?Seated low rows with 25# 2x10 ?Seated high rows with 25# 2x10 ?Seated lat pull-downs with 25# 2x10 ?Seated shoulder rolls 2x10 forward and backward ?Manual Therapy: ?Skilled palpation to identify trigger points prior to TPDN ?Effleurage to BIL UT following TPDN ?Neuromuscular re-ed: ?N/A ?Therapeutic Activity: ?N/A ?Modalities: ?N/A ?Self Care: ?N/A ? ?Trigger Point Dry-Needling  ?Treatment instructions: Expect mild to moderate muscle soreness. S/S of pneumothorax if dry needled over a lung field, and to seek immediate medical attention should they occur. Patient verbalized understanding of these instructions and education. ? ?Patient Consent Given: Yes ?Education handout provided: Yes ?Muscles treated: BIL upper traps ?Electrical stimulation performed: No ?Parameters: N/A ?Treatment response/outcome: Multiple twitch responses and improved muscle extensibility ? ? ? ?[facility] Adult PT Treatment:                                                DATE: 11/19/2021 ?Therapeutic Exercise: ?Sidelying lumbar open book x10 BIL ?Prone knee plank with chin tuck hold 3x30sec ?Prone Y lift-off with chin tuck 2x8 ?Sidelying cervical side bend 2x10 BIL ?Manual Therapy: ?Supine suboccipital release/ effleurage to posterior cervical musculature ?Seated UT pin and stretch x90mn BIL ?Effleurage to BIL cervical paraspinals/ UT ?Neuromuscular re-ed: ?N/A ?Therapeutic Activity: ?N/A ?Modalities: ?N/A ?Self Care: ?N/A ? ? ? ? ? ? ?HOME EXERCISE PROGRAM: ?Access Code: [email or web address]?URL: [email or web address] ?Date: 09/18/2021 ?Prepared by: TVanessa Nicholson?  ?Exercises ?Seated Cervical Retraction - 1 x daily - 7 x weekly - 3 sets - 10 reps ?Standing Shoulder Row  with Anchored Resistance - 1 x daily - 7 x weekly - 3 sets - 10 reps ?Seated Upper Trapezius Stretch - 1 x daily - 7 x weekly - 2 sets - 1-min hold ?Seated Levator Scapulae Stretch - 1 x daily - 7 x weekly - 2 sets - 1-min hold ?  ? ?  Added 11/04/2021:  ?- Seated Assisted Cervical Rotation with Towel  - 1 x daily - 7 x weekly - 10 reps - 5-sec hold ?- Cat Cow  - 1 x daily - 7 x weekly - 3 sets - 10 reps ? ?Added 11/20/2021:  ?- Plank on Knees  - 1 x daily - 7 x weekly - 3 sets - 30-sec hold ?- Sidelying Open Book Thoracic Lumbar Rotation and Extension  - 1 x daily - 7 x weekly - 2 sets - 10 reps ?  ?ASSESSMENT: ?  ?CLINICAL IMPRESSION: ?Due to pt arriving late, the session was truncated today. Due to improved symptoms following TPDN at last session, strengthening exercises were progressed today. She responded well to progressed cervical strengthening today and will continue to benefit from skilled PT to address her primary impairments and return to her prior level of function with less limitation.  ?  ?  ?OBJECTIVE IMPAIRMENTS decreased mobility, decreased ROM, decreased strength, hypomobility, impaired flexibility, postural dysfunction, and pain.  ?  ?ACTIVITY LIMITATIONS community activity, driving, and shopping.  ?  ?PERSONAL FACTORS 1-2 comorbidities: osteopenia  are also affecting patient's functional outcome.  ?  ?  ?REHAB POTENTIAL: Good ?  ?CLINICAL DECISION MAKING: Stable/uncomplicated ?  ?EVALUATION COMPLEXITY: Low ?  ?  ?GOALS: ?Goals reviewed with patient? Yes ?  ?SHORT TERM GOALS: ?  ?STG Name Target Date Goal status  ?1 The pt will report understanding and adherence to her HEP in order to promote independence in the management of her primary impairments. ?Baseline: HEP provided at eval ?10/27/2021: Pt reports HEP adherence 10/16/2021 ACHIEVED  ?  ?LONG TERM GOALS:  ?  ?LTG Name Target Date Goal status  ?1 Pt will achieve BIL cervical rotation AROM of 70 degrees with 0-2/10 pain in order to drive without  limitation. ?Baseline: 60 degrees BIL with 6/10 pain ?11/04/2021: Achieved 11/13/2021 ACHIEVED  ?2 Pt will achieve a FOTO score of 61% in order to demonstrate improved functional ability as it relates to her primar

## 2021-12-06 ENCOUNTER — Ambulatory Visit: Payer: Medicare Other

## 2021-12-06 DIAGNOSIS — M542 Cervicalgia: Secondary | ICD-10-CM

## 2021-12-06 DIAGNOSIS — M6281 Muscle weakness (generalized): Secondary | ICD-10-CM | POA: Diagnosis not present

## 2021-12-09 ENCOUNTER — Ambulatory Visit: Payer: Medicare Other

## 2021-12-09 DIAGNOSIS — M542 Cervicalgia: Secondary | ICD-10-CM

## 2021-12-09 DIAGNOSIS — M6281 Muscle weakness (generalized): Secondary | ICD-10-CM | POA: Diagnosis not present

## 2021-12-09 NOTE — Therapy (Signed)
?OUTPATIENT PHYSICAL THERAPY TREATMENT NOTE ? ? ?Patient Name: Cassandra Morrison ?MRN: [id number] ?DOB:April 22, 1941, 81 y.o., female ?Today's Date: 12/09/2021 ? ?PCP: Lajean Manes, MD ?REFERRING PROVIDER: Lajean Manes, MD ? ? PT End of Session - 12/09/21 1217   ? ? Visit Number 12   ? Number of Visits 16   ? Date for PT Re-Evaluation 01/06/22   ? Authorization Type MCR   ? Authorization Time Period FOTO v6, v10, kx modifier v15   ? Progress Note Due on Visit 20   ? PT Start Time 1219   ? PT Stop Time 1259   ? PT Time Calculation (min) 40 min   ? Activity Tolerance Patient tolerated treatment well   ? Behavior During Therapy [facility] for tasks assessed/performed   ? ?  ?  ? ?  ? ? ? ? ? ? ? ? ? ? ? ?Past Medical History:  ?Diagnosis Date  ? Complication of anesthesia   ? nausea   ? CPAP (continuous positive airway pressure) dependence   ? Depression   ? Diverticulitis   ? GERD (gastroesophageal reflux disease)   ? Hypercholesterolemia   ? Nephrolithiasis   ? OAB (overactive bladder)   ? Obesity   ? Osteopenia   ? Sciatica   ? Sleep apnea   ? ?Past Surgical History:  ?Procedure Laterality Date  ? COLONOSCOPY WITH PROPOFOL N/A 05/20/2015  ? Procedure: COLONOSCOPY WITH PROPOFOL;  Surgeon: Garlan Fair, MD;  Location: [facility];  Service: Endoscopy;  Laterality: N/A;  ? ?Patient Active Problem List  ? Diagnosis Date Noted  ? Spider veins 05/29/2015  ? Paroxysmal supraventricular tachycardia ([place]) 01/29/2014  ? Chest tightness 01/29/2014  ? 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 OSA on CPAP 06/09/2013  ? Obesity, unspecified 06/09/2013  ? ? ?REFERRING DIAG: M54.2 (ICD-10-CM) - Cervicalgia ? ?THERAPY DIAG:  ?Neck pain ? ?Muscle weakness (generalized) ? ?PERTINENT HISTORY: osteopenia ? ?PRECAUTIONS/RESTRICTIONS:  ? ?none ? ?SUBJECTIVE:  ?Pt reports continued improved symptoms, adding that she has had no headaches recently. She reports continued adherence to her HEP. She reports minor posterior neck pain today rated  2-3/10. ? ?PAIN:  ?Are you having pain? Yes ?NPRS scale: 0/10 ?Pain location: Posterior neck ?PAIN TYPE: aching ?Pain description: intermittent  ?Aggravating factors: Turning head, wearing scarf ?Relieving factors: pain medication ? ?OBJECTIVE: ?*Unless otherwise noted, objective information collected previously* ? ?FOTO: 47%, pedicted 61% in 11 visits ?10/27/2021: 59% ?11/26/2021: 55% ? ?CERVICAL AROM ?  ?AROM AROM (deg) ?09/18/2021 AROM ?(Deg) ?10/27/2021 AROM ?(Deg) ?11/04/2021  ?Flexion 70    ?Extension 70    ?Right lateral flexion 30, stretch in Lt upper trap 25   ?Left lateral flexion 40 25   ?Right rotation 60p! 60p!, 68 with mild pain following MET 72 with 6-7/10 pain  ?Left rotation 60p! 60p!, 70p! Following MET 70 with 5/10 pain  ? (Blank rows = not tested) ?  ?  ?UE MMT: ?  ?MMT Right ?09/18/2021 Left ?09/18/2021 Right ?10/27/2021 Left ?10/27/2021 Right ?11/11/2021 Left ?11/11/2021 Right ?12/09/2021 Left ?12/09/2021  ?Shoulder flexion 5/5 5/5        ?Shoulder abduction 5/5 5/5        ?Middle trapezius 4/5 4/5 4/5 4/5 5/5 4+/5 4/5 4+/5  ?Lower trapezius 3/5 3/5 4/5 4/5 4/5 4/5 3+/5 4/5  ?Latissimus dorsi 4/5 4/5 4/5 4/5 5/5 5/5 4+/5 4+/5  ?Elbow flexion 5/5 5/5        ?Elbow extension 5/5 5/5        ?  Wrist flexion 5/5 5/5        ?Wrist extension 5/5 5/5        ?Grip strength            ? (Blank rows = not tested) ? ?FUNCTIONAL TESTS: ?11/26/2021: 25# kettlebell lift from waist to overhead: Unable to lift past chest level ? ? ?[facility] Adult PT Treatment:                                                DATE: 12/09/2021 ?Therapeutic Exercise: ?Seated chin tuck with 3# cable at FreeMotion 2x10 ?Seated cervical rotation with 3# cable at FreeMotion 2x10 BIL ?Seated cervical side bend with 3# cable at FreeMotion 2x10 BIL ?Seated cervical extension with 3# cable at FreeMotion 2x10 ?Prone Y with 1# dumbbells 2x10 ?Prone T with 3# dumbbells 2x10 ?Prone A with 3# dumbbells 2x10 ?Manual Therapy: ?Supine manual cervical distraction x5  minutes ?Supine suboccipital release/ effleurage x8 minutes ?Neuromuscular re-ed: ?N/A ?Therapeutic Activity: ?N/A ?Modalities: ?N/A ?Self Care: ?N/A ? ? ?[place] Adult PT Treatment:                                                DATE: 12/06/2021 ?Therapeutic Exercise: ?Seated chin tuck with 3# cable at FreeMotion 2x10 ?Seated cervical rotation with 3# cable at FreeMotion 2x10 BIL ?Seated cervical side bend with 3# cable at FreeMotion 2x10 BIL ?Seated cervical extension with 3# cable at FreeMotion 2x10 ?Standing shoulder rolls with 8# dumbbells 2x10 forward and backward ?Seated low rows with 30# 2x10 ?Seated high rows with 30# 2x10 ?Seated lat pull-downs with 25# 2x10 ?Seated UT stretch x2 min BIL ?Manual Therapy: ?N/A ?Neuromuscular re-ed: ?N/A ?Therapeutic Activity: ?N/A ?Modalities: ?N/A ?Self Care: ?N/A ? ? ?[facility] [facility] Adult PT Treatment:                                                DATE: 11/26/2021 ?Therapeutic Exercise: ?Prone swimmers with chin tuck hold 2x10 BIL ?Seated low rows with 25# 2x10 ?Seated high rows with 25# 2x10 ?Seated lat pull-downs with 25# 2x10 ?Seated shoulder rolls 2x10 forward and backward ?Supine serratus punches with 3# dumbbells 2x10 ?Manual Therapy: ?Skilled palpation to identify trigger points prior to TPDN ?Effleurage to BIL UT following TPDN ?Neuromuscular re-ed: ?N/A ?Therapeutic Activity: ?N/A ?Modalities: ?N/A ?Self Care: ?N/A ? ?Trigger Point Dry-Needling  ?Treatment instructions: Expect mild to moderate muscle soreness. S/S of pneumothorax if dry needled over a lung field, and to seek immediate medical attention should they occur. Patient verbalized understanding of these instructions and education. ? ?Patient Consent Given: Yes ?Education handout provided: Yes ?Muscles treated: BIL upper traps ?Electrical stimulation performed: No ?Parameters: N/A ?Treatment response/outcome: Multiple twitch responses and improved muscle extensibility ? ? ? ? ? ? ?HOME EXERCISE PROGRAM: ?Access Code:  [email or web address] ?URL: [email or web address] ?Date: 09/18/2021 ?Prepared by: Vanessa [place] ?  ?Exercises ?Seated Cervical Retraction - 1 x daily - 7 x weekly - 3 sets - 10 reps ?Standing Shoulder Row with Anchored Resistance - 1 x daily - 7 x weekly - 3 sets - 10 reps ?Seated Upper Trapezius  Stretch - 1 x daily - 7 x weekly - 2 sets - 1-min hold ?Seated Levator Scapulae Stretch - 1 x daily - 7 x weekly - 2 sets - 1-min hold ?  ? ?Added 11/04/2021:  ?- Seated Assisted Cervical Rotation with Towel  - 1 x daily - 7 x weekly - 10 reps - 5-sec hold ?- Cat Cow  - 1 x daily - 7 x weekly - 3 sets - 10 reps ? ?Added 11/20/2021:  ?- Plank on Knees  - 1 x daily - 7 x weekly - 3 sets - 30-sec hold ?- Sidelying Open Book Thoracic Lumbar Rotation and Extension  - 1 x daily - 7 x weekly - 2 sets - 10 reps ?  ?ASSESSMENT: ?  ?CLINICAL IMPRESSION: ?Pt responded excellently to manual techniques today, reporting a decreased in pain from 2-3/10 to 0/10 following interventions. She then tolerated all exercises with no increase in pain. She will continue to benefit from skilled PT to address her primary impairments and return to her prior level of function with less limitation. ?  ?  ?OBJECTIVE IMPAIRMENTS decreased mobility, decreased ROM, decreased strength, hypomobility, impaired flexibility, postural dysfunction, and pain.  ?  ?ACTIVITY LIMITATIONS community activity, driving, and shopping.  ?  ?PERSONAL FACTORS 1-2 comorbidities: osteopenia  are also affecting patient's functional outcome.  ?  ?  ?REHAB POTENTIAL: Good ?  ?CLINICAL DECISION MAKING: Stable/uncomplicated ?  ?EVALUATION COMPLEXITY: Low ?  ?  ?GOALS: ?Goals reviewed with patient? Yes ?  ?SHORT TERM GOALS: ?  ?STG Name Target Date Goal status  ?1 The pt will report understanding and adherence to her HEP in order to promote independence in the management of her primary impairments. ?Baseline: HEP provided at eval ?10/27/2021: Pt reports HEP adherence 10/16/2021  ACHIEVED  ?  ?LONG TERM GOALS:  ?  ?LTG Name Target Date Goal status  ?1 Pt will achieve BIL cervical rotation AROM of 70 degrees with 0-2/10 pain in order to drive without limitation. ?Baseline: 60 degrees BIL with 6/

## 2021-12-17 DIAGNOSIS — M2041 Other hammer toe(s) (acquired), right foot: Secondary | ICD-10-CM | POA: Diagnosis not present

## 2021-12-17 DIAGNOSIS — L84 Corns and callosities: Secondary | ICD-10-CM | POA: Diagnosis not present

## 2021-12-17 DIAGNOSIS — S99922A Unspecified injury of left foot, initial encounter: Secondary | ICD-10-CM | POA: Diagnosis not present

## 2021-12-21 DIAGNOSIS — H353132 Nonexudative age-related macular degeneration, bilateral, intermediate dry stage: Secondary | ICD-10-CM | POA: Diagnosis not present

## 2021-12-24 ENCOUNTER — Ambulatory Visit: Payer: Medicare Other

## 2021-12-24 DIAGNOSIS — M542 Cervicalgia: Secondary | ICD-10-CM | POA: Diagnosis not present

## 2021-12-24 DIAGNOSIS — M6281 Muscle weakness (generalized): Secondary | ICD-10-CM

## 2021-12-24 NOTE — Therapy (Signed)
OUTPATIENT PHYSICAL THERAPY TREATMENT NOTE   Patient Name: Cassandra Morrison MRN: [id number] DOB:February 24, 1941, 81 y.o., female Today's Date: 12/24/2021  PCP: Lajean Manes, MD REFERRING PROVIDER: Lajean Manes, MD   PT End of Session - 12/24/21 1538     Visit Number 13    Number of Visits 16    Date for PT Re-Evaluation 01/06/22    Authorization Type MCR    Authorization Time Period FOTO v6, v10, kx modifier v15    Progress Note Due on Visit 20    PT Start Time 1536    PT Stop Time 6295    PT Time Calculation (min) 38 min    Activity Tolerance Patient tolerated treatment well    Behavior During Therapy WFL for tasks assessed/performed                       Past Medical History:  Diagnosis Date   Complication of anesthesia    nausea    CPAP (continuous positive airway pressure) dependence    Depression    Diverticulitis    GERD (gastroesophageal reflux disease)    Hypercholesterolemia    Nephrolithiasis    OAB (overactive bladder)    Obesity    Osteopenia    Sciatica    Sleep apnea    Past Surgical History:  Procedure Laterality Date   COLONOSCOPY WITH PROPOFOL N/A 05/20/2015   Procedure: COLONOSCOPY WITH PROPOFOL;  Surgeon: Garlan Fair, MD;  Location: [facility];  Service: Endoscopy;  Laterality: N/A;   Patient Active Problem List   Diagnosis Date Noted   Spider veins 05/29/2015   Paroxysmal supraventricular tachycardia ([place]) 01/29/2014   Chest tightness 01/29/2014   REM sleep behavior disorder 06/09/2013   OSA on CPAP 06/09/2013   Obesity, unspecified 06/09/2013    REFERRING DIAG: M54.2 (ICD-10-CM) - Cervicalgia  THERAPY DIAG:  Neck pain  Muscle weakness (generalized)  PERTINENT HISTORY: osteopenia  PRECAUTIONS/RESTRICTIONS:   none  SUBJECTIVE:  Pt reports she has been feeling "so good," adding that she thinks it's a mixture of her pain medication and her exercises. She reports she is still having stiffness with BIL Lt > Rt  cervical rotation.  PAIN:  Are you having pain? Yes NPRS scale: 0/10 Pain location: Posterior neck PAIN TYPE: aching Pain description: intermittent  Aggravating factors: Turning head, wearing scarf Relieving factors: pain medication  OBJECTIVE: *Unless otherwise noted, objective information collected previously*  FOTO: 47%, pedicted 61% in 11 visits 10/27/2021: 59% 11/26/2021: 55% 12/24/2021: 56%  CERVICAL AROM   AROM AROM (deg) 09/18/2021 AROM (Deg) 10/27/2021 AROM (Deg) 11/04/2021  Flexion 70    Extension 70    Right lateral flexion 30, stretch in Lt upper trap 25   Left lateral flexion 40 25   Right rotation 60p! 60p!, 68 with mild pain following MET 72 with 6-7/10 pain  Left rotation 60p! 60p!, 70p! Following MET 70 with 5/10 pain   (Blank rows = not tested)     UE MMT:   MMT Right 09/18/2021 Left 09/18/2021 Right 10/27/2021 Left 10/27/2021 Right 11/11/2021 Left 11/11/2021 Right 12/09/2021 Left 12/09/2021 Right 12/24/2021 Left 12/24/2021  Shoulder flexion 5/5 5/5          Shoulder abduction 5/5 5/5          Middle trapezius 4/5 4/5 4/5 4/5 5/5 4+/5 4/5 4+/5 4+/5 4/5  Lower trapezius 3/5 3/5 4/5 4/5 4/5 4/5 3+/5 4/5 4/5 4/5  Latissimus dorsi 4/5 4/5 4/5 4/5 5/5 5/5 4+/5  4+/5 4+/5 4+/5  Elbow flexion 5/5 5/5          Elbow extension 5/5 5/5          Wrist flexion 5/5 5/5          Wrist extension 5/5 5/5          Grip strength               (Blank rows = not tested)  FUNCTIONAL TESTS: 11/26/2021: 25# kettlebell lift from waist to overhead: Unable to lift past chest level   [facility] Adult PT Treatment:                                                DATE: 12/24/2021 Therapeutic Exercise: Standing BIL scaption with GTB 2x10 Standing rows with GTB 2x10 Standing BIL shoulder extension with GTB 2x10 Quadruped chin tucks 3x15 Seated cervical rotation isotonics 2x10 BIL Manual Therapy: N/A Neuromuscular re-ed: N/A Therapeutic Activity: Re-assessment of objective measures  with pt education Re-administration of FOTO with pt education Modalities: N/A Self Care: N/A   [facility] Adult PT Treatment:                                                DATE: 12/09/2021 Therapeutic Exercise: Seated chin tuck with 3# cable at FreeMotion 2x10 Seated cervical rotation with 3# cable at FreeMotion 2x10 BIL Seated cervical side bend with 3# cable at FreeMotion 2x10 BIL Seated cervical extension with 3# cable at FreeMotion 2x10 Prone Y with 1# dumbbells 2x10 Prone T with 3# dumbbells 2x10 Prone A with 3# dumbbells 2x10 Manual Therapy: Supine manual cervical distraction x5 minutes Supine suboccipital release/ effleurage x8 minutes Neuromuscular re-ed: N/A Therapeutic Activity: N/A Modalities: N/A Self Care: N/A   [facility] Adult PT Treatment:                                                DATE: 12/06/2021 Therapeutic Exercise: Seated chin tuck with 3# cable at FreeMotion 2x10 Seated cervical rotation with 3# cable at FreeMotion 2x10 BIL Seated cervical side bend with 3# cable at FreeMotion 2x10 BIL Seated cervical extension with 3# cable at FreeMotion 2x10 Standing shoulder rolls with 8# dumbbells 2x10 forward and backward Seated low rows with 30# 2x10 Seated high rows with 30# 2x10 Seated lat pull-downs with 25# 2x10 Seated UT stretch x2 min BIL Manual Therapy: N/A Neuromuscular re-ed: N/A Therapeutic Activity: N/A Modalities: N/A Self Care: N/A      HOME EXERCISE PROGRAM: Access Code: [email or web address] URL: [email or web address] Date: 09/18/2021 Prepared by: Vanessa [place]   Exercises Seated Cervical Retraction - 1 x daily - 7 x weekly - 3 sets - 10 reps Standing Shoulder Row with Anchored Resistance - 1 x daily - 7 x weekly - 3 sets - 10 reps Seated Upper Trapezius Stretch - 1 x daily - 7 x weekly - 2 sets - 1-min hold Seated Levator Scapulae Stretch - 1 x daily - 7 x weekly - 2 sets - 1-min hold    Added 11/04/2021:  - Seated Assisted  Cervical Rotation with Towel  -  1 x daily - 7 x weekly - 10 reps - 5-sec hold - Cat Cow  - 1 x daily - 7 x weekly - 3 sets - 10 reps  Added 11/20/2021:  - Plank on Knees  - 1 x daily - 7 x weekly - 3 sets - 30-sec hold - Sidelying Open Book Thoracic Lumbar Rotation and Extension  - 1 x daily - 7 x weekly - 2 sets - 10 reps  Added 12/24/2021: - Standing Shoulder Scaption with Resistance  - 1 x daily - 7 x weekly - 3 sets - 10 reps - 3-sec hold - Shoulder extension with resistance - Neutral  - 1 x daily - 7 x weekly - 3 sets - 10 reps - 3-sec hold   ASSESSMENT:   CLINICAL IMPRESSION: Pt responded well to all interventions today, demonstrating good form and no pain with newly added exercises. Pt continues to make improvements in global parascapular strength, although she remains limited in this area. She will continue to benefit from skilled PT to address her primary impairments and return to her prior level of function with less limitation.     OBJECTIVE IMPAIRMENTS decreased mobility, decreased ROM, decreased strength, hypomobility, impaired flexibility, postural dysfunction, and pain.    ACTIVITY LIMITATIONS community activity, driving, and shopping.    PERSONAL FACTORS 1-2 comorbidities: osteopenia  are also affecting patient's functional outcome.          GOALS: Goals reviewed with patient? Yes   SHORT TERM GOALS:   STG Name Target Date Goal status  1 The pt will report understanding and adherence to her HEP in order to promote independence in the management of her primary impairments. Baseline: HEP provided at eval 10/27/2021: Pt reports HEP adherence 10/16/2021 ACHIEVED    LONG TERM GOALS:    LTG Name Target Date Goal status  1 Pt will achieve BIL cervical rotation AROM of 70 degrees with 0-2/10 pain in order to drive without limitation. Baseline: 60 degrees BIL with 6/10 pain 11/04/2021: Achieved 11/13/2021 ACHIEVED  2 Pt will achieve a FOTO score of 61% in order to  demonstrate improved functional ability as it relates to her primary impairments. Baseline: 47% at visit 4 10/27/2021: 59% 11/26/2021: 55% 12/24/2021: 56% 11/13/2021 PROGRESSING  3 Pt will achieve BIL global parascapular strength of 4+/5 in order to promote long-term postural improvement in the prophylactic care of future mechanical neck pain. Baseline: See MMT chart 10/27/2021: See updated MMT chart 11/11/2021: See updated MMT chart 12/09/2021: See updated MMT chart 12/24/2021: See updated MMT 11/13/2021 PROGRESSING  4 Pt will report ability to wear scarfs with 0/10 pain in order to dress for the cold weather. Baseline: Unable to wear scarfs >30 minutes due to increased neck pain 11/11/2021: Pt reports no need to wear scarfs at this time. 11/13/2021 DISCONTINUED    PLAN: PT FREQUENCY: 1x/week   PT DURATION: 8 weeks   PLANNED INTERVENTIONS: Therapeutic exercises, Therapeutic activity, Neuro Muscular re-education, Patient/Family education, Joint mobilization, Dry Needling, Spinal mobilization, Cryotherapy, Moist heat, Taping, Traction, and Manual therapy   PLAN FOR NEXT SESSION: progress DNF/ parascapular strengthening/ mobility, manual techniques for pain modulation    Vanessa [place], PT, DPT 12/24/21 4:14 PM

## 2021-12-26 DIAGNOSIS — Z1231 Encounter for screening mammogram for malignant neoplasm of breast: Secondary | ICD-10-CM | POA: Diagnosis not present

## 2021-12-30 ENCOUNTER — Ambulatory Visit: Payer: Medicare Other | Attending: Geriatric Medicine

## 2021-12-30 DIAGNOSIS — M542 Cervicalgia: Secondary | ICD-10-CM | POA: Insufficient documentation

## 2021-12-30 DIAGNOSIS — M6281 Muscle weakness (generalized): Secondary | ICD-10-CM | POA: Insufficient documentation

## 2021-12-30 NOTE — Therapy (Signed)
OUTPATIENT PHYSICAL THERAPY TREATMENT NOTE   Patient Name: Cassandra Morrison MRN: [id number] DOB:23-Jul-1941, 81 y.o., female Today's Date: 12/30/2021  PCP: Lajean Manes, MD REFERRING PROVIDER: Lajean Manes, MD   PT End of Session - 12/30/21 1230     Visit Number 14    Number of Visits 16    Date for PT Re-Evaluation 01/06/22    Authorization Type MCR    Authorization Time Period FOTO v6, v10, kx modifier v15    Progress Note Due on Visit 20    PT Start Time 1218    PT Stop Time 1259   3 minutes dry needle insertion   PT Time Calculation (min) 41 min    Activity Tolerance Patient tolerated treatment well    Behavior During Therapy WFL for tasks assessed/performed                        Past Medical History:  Diagnosis Date   Complication of anesthesia    nausea    CPAP (continuous positive airway pressure) dependence    Depression    Diverticulitis    GERD (gastroesophageal reflux disease)    Hypercholesterolemia    Nephrolithiasis    OAB (overactive bladder)    Obesity    Osteopenia    Sciatica    Sleep apnea    Past Surgical History:  Procedure Laterality Date   COLONOSCOPY WITH PROPOFOL N/A 05/20/2015   Procedure: COLONOSCOPY WITH PROPOFOL;  Surgeon: Garlan Fair, MD;  Location: [facility];  Service: Endoscopy;  Laterality: N/A;   Patient Active Problem List   Diagnosis Date Noted   Spider veins 05/29/2015   Paroxysmal supraventricular tachycardia ([place]) 01/29/2014   Chest tightness 01/29/2014   REM sleep behavior disorder 06/09/2013   OSA on CPAP 06/09/2013   Obesity, unspecified 06/09/2013    REFERRING DIAG: M54.2 (ICD-10-CM) - Cervicalgia  THERAPY DIAG:  Neck pain  Muscle weakness (generalized)  PERTINENT HISTORY: osteopenia  PRECAUTIONS/RESTRICTIONS:   none  SUBJECTIVE:  Pt reports she has been feeling "so good," adding that she thinks it's a mixture of her pain medication and her exercises. She reports she is still  having stiffness with BIL Lt > Rt cervical rotation.  PAIN:  Are you having pain? Yes NPRS scale: 0/10 Pain location: Posterior neck PAIN TYPE: aching Pain description: intermittent  Aggravating factors: Turning head, wearing scarf Relieving factors: pain medication  OBJECTIVE: *Unless otherwise noted, objective information collected previously*  FOTO: 47%, pedicted 61% in 11 visits 10/27/2021: 59% 11/26/2021: 55% 12/24/2021: 56%  CERVICAL AROM   AROM AROM (deg) 09/18/2021 AROM (Deg) 10/27/2021 AROM (Deg) 11/04/2021  Flexion 70    Extension 70    Right lateral flexion 30, stretch in Lt upper trap 25   Left lateral flexion 40 25   Right rotation 60p! 60p!, 68 with mild pain following MET 72 with 6-7/10 pain  Left rotation 60p! 60p!, 70p! Following MET 70 with 5/10 pain   (Blank rows = not tested)     UE MMT:   MMT Right 09/18/2021 Left 09/18/2021 Right 10/27/2021 Left 10/27/2021 Right 11/11/2021 Left 11/11/2021 Right 12/09/2021 Left 12/09/2021 Right 12/24/2021 Left 12/24/2021  Shoulder flexion 5/5 5/5          Shoulder abduction 5/5 5/5          Middle trapezius 4/5 4/5 4/5 4/5 5/5 4+/5 4/5 4+/5 4+/5 4/5  Lower trapezius 3/5 3/5 4/5 4/5 4/5 4/5 3+/5 4/5 4/5 4/5  Latissimus dorsi  4/5 4/5 4/5 4/5 5/5 5/5 4+/5 4+/5 4+/5 4+/5  Elbow flexion 5/5 5/5          Elbow extension 5/5 5/5          Wrist flexion 5/5 5/5          Wrist extension 5/5 5/5          Grip strength               (Blank rows = not tested)  FUNCTIONAL TESTS: 11/26/2021: 25# kettlebell lift from waist to overhead: Unable to lift past chest level  [facility] Adult PT Treatment:                                                DATE: 12/30/2021 Therapeutic Exercise: Seated rows with 40# cable 2x10 Seated high rows with 40# cable 2x10 Seated lat pull-downs with 30# cable 2x10 Seated shoulder rolls 2x10 Manual Therapy: Effleurage/ STM to BIL UT/ cervical paraspinals Skilled palpation to identify trigger points prior to  TPDN Neuromuscular re-ed: N/A Therapeutic Activity: N/A Modalities: N/A Self Care: N/A  Trigger Point Dry-Needling  Treatment instructions: Expect mild to moderate muscle soreness. S/S of pneumothorax if dry needled over a lung field, and to seek immediate medical attention should they occur. Patient verbalized understanding of these instructions and education.  Patient Consent Given: Yes Education handout provided: Previously provided Muscles treated: BIL UT Electrical stimulation performed: No Parameters: N/A Treatment response/outcome: Improved muscle extensibility, improved pain, improved gross BIL cervical rotation AROM    [facility] Adult PT Treatment:                                                DATE: 12/24/2021 Therapeutic Exercise: Standing BIL scaption with GTB 2x10 Standing rows with GTB 2x10 Standing BIL shoulder extension with GTB 2x10 Quadruped chin tucks 3x15 Seated cervical rotation isotonics 2x10 BIL Manual Therapy: N/A Neuromuscular re-ed: N/A Therapeutic Activity: Re-assessment of objective measures with pt education Re-administration of FOTO with pt education Modalities: N/A Self Care: N/A   [facility] Adult PT Treatment:                                                DATE: 12/09/2021 Therapeutic Exercise: Seated chin tuck with 3# cable at FreeMotion 2x10 Seated cervical rotation with 3# cable at FreeMotion 2x10 BIL Seated cervical side bend with 3# cable at FreeMotion 2x10 BIL Seated cervical extension with 3# cable at FreeMotion 2x10 Prone Y with 1# dumbbells 2x10 Prone T with 3# dumbbells 2x10 Prone A with 3# dumbbells 2x10 Manual Therapy: Supine manual cervical distraction x5 minutes Supine suboccipital release/ effleurage x8 minutes Neuromuscular re-ed: N/A Therapeutic Activity: N/A Modalities: N/A Self Care: N/A   [facility] Adult PT Treatment:                                                DATE: 12/06/2021 Therapeutic Exercise: Seated chin  tuck with 3# cable at  FreeMotion 2x10 Seated cervical rotation with 3# cable at FreeMotion 2x10 BIL Seated cervical side bend with 3# cable at FreeMotion 2x10 BIL Seated cervical extension with 3# cable at [place] 2x10 Standing shoulder rolls with 8# dumbbells 2x10 forward and backward Seated low rows with 30# 2x10 Seated high rows with 30# 2x10 Seated lat pull-downs with 25# 2x10 Seated UT stretch x2 min BIL Manual Therapy: N/A Neuromuscular re-ed: N/A Therapeutic Activity: N/A Modalities: N/A Self Care: N/A      HOME EXERCISE PROGRAM: Access Code: [email or web address] URL: [email or web address] Date: 09/18/2021 Prepared by: Vanessa [place]   Exercises Seated Cervical Retraction - 1 x daily - 7 x weekly - 3 sets - 10 reps Standing Shoulder Row with Anchored Resistance - 1 x daily - 7 x weekly - 3 sets - 10 reps Seated Upper Trapezius Stretch - 1 x daily - 7 x weekly - 2 sets - 1-min hold Seated Levator Scapulae Stretch - 1 x daily - 7 x weekly - 2 sets - 1-min hold    Added 11/04/2021:  - Seated Assisted Cervical Rotation with Towel  - 1 x daily - 7 x weekly - 10 reps - 5-sec hold - Cat Cow  - 1 x daily - 7 x weekly - 3 sets - 10 reps  Added 11/20/2021:  - Plank on Knees  - 1 x daily - 7 x weekly - 3 sets - 30-sec hold - Sidelying Open Book Thoracic Lumbar Rotation and Extension  - 1 x daily - 7 x weekly - 2 sets - 10 reps  Added 12/24/2021: - Standing Shoulder Scaption with Resistance  - 1 x daily - 7 x weekly - 3 sets - 10 reps - 3-sec hold - Shoulder extension with resistance - Neutral  - 1 x daily - 7 x weekly - 3 sets - 10 reps - 3-sec hold   ASSESSMENT:   CLINICAL IMPRESSION: Pt responded well to all interventions today, demonstrating good form and no pain with performed exercises. She again reports an excellent response to TPDN with improved muscle extensibility, pain levels, and gross BIL cervical rotation AROM.  She will continue to benefit from  skilled PT to address her primary impairments and return to her prior level of function with less limitation.     OBJECTIVE IMPAIRMENTS decreased mobility, decreased ROM, decreased strength, hypomobility, impaired flexibility, postural dysfunction, and pain.    ACTIVITY LIMITATIONS community activity, driving, and shopping.    PERSONAL FACTORS 1-2 comorbidities: osteopenia  are also affecting patient's functional outcome.          GOALS: Goals reviewed with patient? Yes   SHORT TERM GOALS:   STG Name Target Date Goal status  1 The pt will report understanding and adherence to her HEP in order to promote independence in the management of her primary impairments. Baseline: HEP provided at eval 10/27/2021: Pt reports HEP adherence 10/16/2021 ACHIEVED    LONG TERM GOALS:    LTG Name Target Date Goal status  1 Pt will achieve BIL cervical rotation AROM of 70 degrees with 0-2/10 pain in order to drive without limitation. Baseline: 60 degrees BIL with 6/10 pain 11/04/2021: Achieved 11/13/2021 ACHIEVED  2 Pt will achieve a FOTO score of 61% in order to demonstrate improved functional ability as it relates to her primary impairments. Baseline: 47% at visit 4 10/27/2021: 59% 11/26/2021: 55% 12/24/2021: 56% 11/13/2021 PROGRESSING  3 Pt will achieve BIL global parascapular strength of 4+/5 in order to promote long-term postural improvement in  the prophylactic care of future mechanical neck pain. Baseline: See MMT chart 10/27/2021: See updated MMT chart 11/11/2021: See updated MMT chart 12/09/2021: See updated MMT chart 12/24/2021: See updated MMT 11/13/2021 PROGRESSING  4 Pt will report ability to wear scarfs with 0/10 pain in order to dress for the cold weather. Baseline: Unable to wear scarfs >30 minutes due to increased neck pain 11/11/2021: Pt reports no need to wear scarfs at this time. 11/13/2021 DISCONTINUED    PLAN: PT FREQUENCY: 1x/week   PT DURATION: 8 weeks   PLANNED INTERVENTIONS:  Therapeutic exercises, Therapeutic activity, Neuro Muscular re-education, Patient/Family education, Joint mobilization, Dry Needling, Spinal mobilization, Cryotherapy, Moist heat, Taping, Traction, and Manual therapy   PLAN FOR NEXT SESSION: progress DNF/ parascapular strengthening/ mobility, manual techniques for pain modulation    Vanessa [place], PT, DPT 12/30/21 1:03 PM

## 2022-01-07 ENCOUNTER — Ambulatory Visit: Payer: Medicare Other

## 2022-01-07 DIAGNOSIS — M542 Cervicalgia: Secondary | ICD-10-CM

## 2022-01-07 DIAGNOSIS — M6281 Muscle weakness (generalized): Secondary | ICD-10-CM

## 2022-01-07 NOTE — Therapy (Signed)
OUTPATIENT PHYSICAL THERAPY TREATMENT NOTE/ RE-CERTIFICATION   Patient Name: Cassandra Morrison MRN: [id number] DOB:September 23, 1940, 81 y.o., female Today's Date: 01/07/2022  PCP: Lajean Manes, MD REFERRING PROVIDER: Lajean Manes, MD                Past Medical History:  Diagnosis Date   Complication of anesthesia    nausea    CPAP (continuous positive airway pressure) dependence    Depression    Diverticulitis    GERD (gastroesophageal reflux disease)    Hypercholesterolemia    Nephrolithiasis    OAB (overactive bladder)    Obesity    Osteopenia    Sciatica    Sleep apnea    Past Surgical History:  Procedure Laterality Date   COLONOSCOPY WITH PROPOFOL N/A 05/20/2015   Procedure: COLONOSCOPY WITH PROPOFOL;  Surgeon: Garlan Fair, MD;  Location: [facility];  Service: Endoscopy;  Laterality: N/A;   Patient Active Problem List   Diagnosis Date Noted   Spider veins 05/29/2015   Paroxysmal supraventricular tachycardia ([place]) 01/29/2014   Chest tightness 01/29/2014   REM sleep behavior disorder 06/09/2013   OSA on CPAP 06/09/2013   Obesity, unspecified 06/09/2013    REFERRING DIAG: M54.2 (ICD-10-CM) - Cervicalgia  THERAPY DIAG:  No diagnosis found.  PERTINENT HISTORY: osteopenia  PRECAUTIONS/RESTRICTIONS:   none  SUBJECTIVE:  Pt denies any pain currently. She asks which exercises are most important for her to do at home. She also requests for more PT visits to be certified.  PAIN:  Are you having pain? Yes NPRS scale: 0/10 Pain location: Posterior neck PAIN TYPE: aching Pain description: intermittent  Aggravating factors: Turning head, wearing scarf Relieving factors: pain medication  OBJECTIVE: *Unless otherwise noted, objective information collected previously*  FOTO: 47%, pedicted 61% in 11 visits 10/27/2021: 59% 11/26/2021: 55% 12/24/2021: 56%  CERVICAL AROM   AROM AROM (deg) 09/18/2021 AROM (Deg) 10/27/2021 AROM (Deg) 11/04/2021  AROM (Deg) 01/07/2022  Flexion 70     Extension 70     Right lateral flexion 30, stretch in Lt upper trap 25    Left lateral flexion 40 25    Right rotation 60p! 60p!, 68 with mild pain following MET 72 with 6-7/10 pain 82 with 2/10 pain  Left rotation 60p! 60p!, 70p! Following MET 70 with 5/10 pain 76 with 3-4/10 pain   (Blank rows = not tested)     UE MMT:   MMT Right 09/18/2021 Left 09/18/2021 Right 10/27/2021 Left 10/27/2021 Right 11/11/2021 Left 11/11/2021 Right 12/09/2021 Left 12/09/2021 Right 12/24/2021 Left 12/24/2021  Shoulder flexion 5/5 5/5          Shoulder abduction 5/5 5/5          Middle trapezius 4/5 4/5 4/5 4/5 5/5 4+/5 4/5 4+/5 4+/5 4/5  Lower trapezius 3/5 3/5 4/5 4/5 4/5 4/5 3+/5 4/5 4/5 4/5  Latissimus dorsi 4/5 4/5 4/5 4/5 5/5 5/5 4+/5 4+/5 4+/5 4+/5  Elbow flexion 5/5 5/5          Elbow extension 5/5 5/5          Wrist flexion 5/5 5/5          Wrist extension 5/5 5/5          Grip strength               (Blank rows = not tested)  FUNCTIONAL TESTS: 11/26/2021: 25# kettlebell lift from waist to overhead: Unable to lift past chest level   [facility] Adult PT Treatment:  DATE: 01/07/2022 Therapeutic Exercise: Standing BIL shoulder scaption with 1# dumbbells with back against wall 3x12 Standing shoulder rolls with 1# dumbbells 3x10 forward and backward Seated rows with 45# cable 2x10 Seated high rows with 40# cable 2x10 Seated lat pull-downs with 35# cable 2x10 Manual Therapy: N/A Neuromuscular re-ed: N/A Therapeutic Activity: Re-assessment of objective measures with pt education on progress made in PT Pt education regarding POC and ways to progress home exercises Modalities: N/A Self Care: N/A   [facility] Adult PT Treatment:                                                DATE: 12/30/2021 Therapeutic Exercise: Seated rows with 40# cable 2x10 Seated high rows with 40# cable 2x10 Seated lat pull-downs with 30# cable  2x10 Seated shoulder rolls 2x10 Manual Therapy: Effleurage/ STM to BIL UT/ cervical paraspinals Skilled palpation to identify trigger points prior to TPDN Neuromuscular re-ed: N/A Therapeutic Activity: N/A Modalities: N/A Self Care: N/A  Trigger Point Dry-Needling  Treatment instructions: Expect mild to moderate muscle soreness. S/S of pneumothorax if dry needled over a lung field, and to seek immediate medical attention should they occur. Patient verbalized understanding of these instructions and education.  Patient Consent Given: Yes Education handout provided: Previously provided Muscles treated: BIL UT Electrical stimulation performed: No Parameters: N/A Treatment response/outcome: Improved muscle extensibility, improved pain, improved gross BIL cervical rotation AROM    [facility] Adult PT Treatment:                                                DATE: 12/24/2021 Therapeutic Exercise: Standing BIL scaption with GTB 2x10 Standing rows with GTB 2x10 Standing BIL shoulder extension with GTB 2x10 Quadruped chin tucks 3x15 Seated cervical rotation isotonics 2x10 BIL Manual Therapy: N/A Neuromuscular re-ed: N/A Therapeutic Activity: Re-assessment of objective measures with pt education Re-administration of FOTO with pt education Modalities: N/A Self Care: N/A      HOME EXERCISE PROGRAM: Access Code: [email or web address] URL: [email or web address] Date: 09/18/2021 Prepared by: Vanessa Reyno   Exercises Seated Cervical Retraction - 1 x daily - 7 x weekly - 3 sets - 10 reps Standing Shoulder Row with Anchored Resistance - 1 x daily - 7 x weekly - 3 sets - 10 reps Seated Upper Trapezius Stretch - 1 x daily - 7 x weekly - 2 sets - 1-min hold Seated Levator Scapulae Stretch - 1 x daily - 7 x weekly - 2 sets - 1-min hold    Added 11/04/2021:  - Seated Assisted Cervical Rotation with Towel  - 1 x daily - 7 x weekly - 10 reps - 5-sec hold - Cat Cow  - 1 x daily - 7  x weekly - 3 sets - 10 reps  Added 11/20/2021:  - Plank on Knees  - 1 x daily - 7 x weekly - 3 sets - 30-sec hold - Sidelying Open Book Thoracic Lumbar Rotation and Extension  - 1 x daily - 7 x weekly - 2 sets - 10 reps  Added 12/24/2021: - Standing Shoulder Scaption with Resistance  - 1 x daily - 7 x weekly - 3 sets - 10 reps - 3-sec hold - Shoulder extension with resistance - Neutral  -  1 x daily - 7 x weekly - 3 sets - 10 reps - 3-sec hold   ASSESSMENT:   CLINICAL IMPRESSION: Pt responded well to all interventions today, demonstrating good form and no increase in pain with progressed exercises. Upon re-assessment, the pt has made further improvement in cervical rotation AROM BIL, although these motions continue to be painful. She requests more appointments in PT and will benefit from continued skilled PT to address her primary impairments and return to her prior level of function with less limitation.      OBJECTIVE IMPAIRMENTS decreased mobility, decreased ROM, decreased strength, hypomobility, impaired flexibility, postural dysfunction, and pain.    ACTIVITY LIMITATIONS community activity, driving, and shopping.    PERSONAL FACTORS 1-2 comorbidities: osteopenia  are also affecting patient's functional outcome.          GOALS: Goals reviewed with patient? Yes   SHORT TERM GOALS:   STG Name Target Date Goal status  1 The pt will report understanding and adherence to her HEP in order to promote independence in the management of her primary impairments. Baseline: HEP provided at eval 10/27/2021: Pt reports HEP adherence 10/16/2021 ACHIEVED    LONG TERM GOALS:    LTG Name Target Date Goal status  1 Pt will achieve BIL cervical rotation AROM of 70 degrees with 0-2/10 pain in order to drive without limitation. Baseline: 60 degrees BIL with 6/10 pain 11/04/2021: Achieved 11/13/2021 ACHIEVED  2 Pt will achieve a FOTO score of 61% in order to demonstrate improved functional ability as it  relates to her primary impairments. Baseline: 47% at visit 4 10/27/2021: 59% 11/26/2021: 55% 12/24/2021: 56% 11/13/2021 PROGRESSING  3 Pt will achieve BIL global parascapular strength of 4+/5 in order to promote long-term postural improvement in the prophylactic care of future mechanical neck pain. Baseline: See MMT chart 10/27/2021: See updated MMT chart 11/11/2021: See updated MMT chart 12/09/2021: See updated MMT chart 12/24/2021: See updated MMT 11/13/2021 PROGRESSING  4 Pt will report ability to wear scarfs with 0/10 pain in order to dress for the cold weather. Baseline: Unable to wear scarfs >30 minutes due to increased neck pain 11/11/2021: Pt reports no need to wear scarfs at this time. 11/13/2021 DISCONTINUED    PLAN: PT FREQUENCY: 1x/week   PT DURATION: 8 weeks   PLANNED INTERVENTIONS: Therapeutic exercises, Therapeutic activity, Neuro Muscular re-education, Patient/Family education, Joint mobilization, Dry Needling, Spinal mobilization, Cryotherapy, Moist heat, Taping, Traction, and Manual therapy   PLAN FOR NEXT SESSION: progress DNF/ parascapular strengthening/ mobility, manual techniques for pain modulation    Vanessa H[place]ia, PT, DPT 01/07/22 12:18 PM

## 2022-01-13 ENCOUNTER — Ambulatory Visit: Payer: Medicare Other

## 2022-01-13 DIAGNOSIS — M542 Cervicalgia: Secondary | ICD-10-CM

## 2022-01-13 DIAGNOSIS — M6281 Muscle weakness (generalized): Secondary | ICD-10-CM | POA: Diagnosis not present

## 2022-01-13 NOTE — Therapy (Signed)
OUTPATIENT PHYSICAL THERAPY TREATMENT NOTE/ RE-CERTIFICATION   Patient Name: Cassandra Morrison MRN: [id number] DOB:10/31/1940, 81 y.o., female Today's Date: 01/13/2022  PCP: Stoneking, Hal, MD REFERRING PROVIDER: Stoneking, Hal, MD   PT End of Session - 01/13/22 1216     Visit Number 16    Number of Visits 19    Date for PT Re-Evaluation 02/11/22    Authorization Type MCR    Authorization Time Period FOTO v6, v10, kx modifier v15    Progress Note Due on Visit 20    PT Start Time 1217    PT Stop Time 1257    PT Time Calculation (min) 40 min    Activity Tolerance Patient tolerated treatment well    Behavior During Therapy WFL for tasks assessed/performed                         Past Medical History:  Diagnosis Date   Complication of anesthesia    nausea    CPAP (continuous positive airway pressure) dependence    Depression    Diverticulitis    GERD (gastroesophageal reflux disease)    Hypercholesterolemia    Nephrolithiasis    OAB (overactive bladder)    Obesity    Osteopenia    Sciatica    Sleep apnea    Past Surgical History:  Procedure Laterality Date   COLONOSCOPY WITH PROPOFOL N/A 05/20/2015   Procedure: COLONOSCOPY WITH PROPOFOL;  Surgeon: Martin K Johnson, MD;  Location: [facility];  Service: Endoscopy;  Laterality: N/A;   Patient Active Problem List   Diagnosis Date Noted   Spider veins 05/29/2015   Paroxysmal supraventricular tachycardia ([facility]) 01/29/2014   Chest tightness 01/29/2014   REM sleep behavior disorder 06/09/2013   OSA on CPAP 06/09/2013   Obesity, unspecified 06/09/2013    REFERRING DIAG: M54.2 (ICD-10-CM) - Cervicalgia  THERAPY DIAG:  Neck pain  Muscle weakness (generalized)  PERTINENT HISTORY: osteopenia  PRECAUTIONS/RESTRICTIONS:   none  SUBJECTIVE:  Pt reports that she has been doing great. She continues to be adherent to her HEP and only reports minor pain with turning her head to end range.   PAIN:   Are you having pain? Yes NPRS scale: 0/10 Pain location: Posterior neck PAIN TYPE: aching Pain description: intermittent  Aggravating factors: Turning head, wearing scarf Relieving factors: pain medication  OBJECTIVE: *Unless otherwise noted, objective information collected previously*  FOTO: 47%, pedicted 61% in 11 visits 10/27/2021: 59% 11/26/2021: 55% 12/24/2021: 56%  CERVICAL AROM   AROM AROM (deg) 09/18/2021 AROM (Deg) 10/27/2021 AROM (Deg) 11/04/2021 AROM (Deg) 01/07/2022  Flexion 70     Extension 70     Right lateral flexion 30, stretch in Lt upper trap 25    Left lateral flexion 40 25    Right rotation 60p! 60p!, 68 with mild pain following MET 72 with 6-7/10 pain 82 with 2/10 pain  Left rotation 60p! 60p!, 70p! Following MET 70 with 5/10 pain 76 with 3-4/10 pain   (Blank rows = not tested)     UE MMT:   MMT Right 09/18/2021 Left 09/18/2021 Right 10/27/2021 Left 10/27/2021 Right 11/11/2021 Left 11/11/2021 Right 12/09/2021 Left 12/09/2021 Right 12/24/2021 Left 12/24/2021  Shoulder flexion 5/5 5/5          Shoulder abduction 5/5 5/5          Middle trapezius 4/5 4/5 4/5 4/5 5/5 4+/5 4/5 4+/5 4+/5 4/5  Lower trapezius 3/5 3/5 4/5 4/5 4/5 4/5 3+/5 4/5 4/5   4/5  Latissimus dorsi 4/5 4/5 4/5 4/5 5/5 5/5 4+/5 4+/5 4+/5 4+/5  Elbow flexion 5/5 5/5          Elbow extension 5/5 5/5          Wrist flexion 5/5 5/5          Wrist extension 5/5 5/5          Grip strength               (Blank rows = not tested)  FUNCTIONAL TESTS: 11/26/2021: 25# kettlebell lift from waist to overhead: Unable to lift past chest level   [facility] Adult PT Treatment:                                                DATE: 01/13/2022 Therapeutic Exercise: Seated UBE x2 min forward/ x2 min backward while collecting subjective information Seated chin tucks with 7# cable to head attachment x20 Seated cervical side bend with 7# cable to head attachment x20 BIL Seated cervical rotation with 7# cable to head  attachment x20 BIL Standing chin tucks x10 with 5-sec holds Seated UT stretch x2min BIL Seated rows with 45# cable 2x10 Seated high rows with 45# cable 2x10 Seated lat pull-downs with 25# cable 2x10 Seated shoulder rolls 2x10 forward and backward Manual Therapy: N/A Neuromuscular re-ed: N/A Therapeutic Activity: N/A Modalities: N/A Self Care: N/A   [facility] Adult PT Treatment:                                                DATE: 01/07/2022 Therapeutic Exercise: Standing BIL shoulder scaption with 1# dumbbells with back against wall 3x12 Standing shoulder rolls with 1# dumbbells 3x10 forward and backward Seated rows with 45# cable 2x10 Seated high rows with 40# cable 2x10 Seated lat pull-downs with 35# cable 2x10 Manual Therapy: N/A Neuromuscular re-ed: N/A Therapeutic Activity: Re-assessment of objective measures with pt education on progress made in PT Pt education regarding POC and ways to progress home exercises Modalities: N/A Self Care: N/A   [facility] Adult PT Treatment:                                                DATE: 12/30/2021 Therapeutic Exercise: Seated rows with 40# cable 2x10 Seated high rows with 40# cable 2x10 Seated lat pull-downs with 30# cable 2x10 Seated shoulder rolls 2x10 Manual Therapy: Effleurage/ STM to BIL UT/ cervical paraspinals Skilled palpation to identify trigger points prior to TPDN Neuromuscular re-ed: N/A Therapeutic Activity: N/A Modalities: N/A Self Care: N/A  Trigger Point Dry-Needling  Treatment instructions: Expect mild to moderate muscle soreness. S/S of pneumothorax if dry needled over a lung field, and to seek immediate medical attention should they occur. Patient verbalized understanding of these instructions and education.  Patient Consent Given: Yes Education handout provided: Previously provided Muscles treated: BIL UT Electrical stimulation performed: No Parameters: N/A Treatment response/outcome: Improved muscle  extensibility, improved pain, improved gross BIL cervical rotation AROM      HOME EXERCISE PROGRAM: Access Code: [email or web address] URL: [email or web address] Date: 09/18/2021 Prepared by:       Exercises Seated Cervical Retraction - 1 x daily - 7 x weekly - 3 sets - 10 reps Standing Shoulder Row with Anchored Resistance - 1 x daily - 7 x weekly - 3 sets - 10 reps Seated Upper Trapezius Stretch - 1 x daily - 7 x weekly - 2 sets - 1-min hold Seated Levator Scapulae Stretch - 1 x daily - 7 x weekly - 2 sets - 1-min hold    Added 11/04/2021:  - Seated Assisted Cervical Rotation with Towel  - 1 x daily - 7 x weekly - 10 reps - 5-sec hold - Cat Cow  - 1 x daily - 7 x weekly - 3 sets - 10 reps  Added 11/20/2021:  - Plank on Knees  - 1 x daily - 7 x weekly - 3 sets - 30-sec hold - Sidelying Open Book Thoracic Lumbar Rotation and Extension  - 1 x daily - 7 x weekly - 2 sets - 10 reps  Added 12/24/2021: - Standing Shoulder Scaption with Resistance  - 1 x daily - 7 x weekly - 3 sets - 10 reps - 3-sec hold - Shoulder extension with resistance - Neutral  - 1 x daily - 7 x weekly - 3 sets - 10 reps - 3-sec hold   ASSESSMENT:   CLINICAL IMPRESSION: Pt continues to show signs of a positive response to therapy. She responded well to progressed exercises today and will continue to benefit from skilled PT to address her primary impairments and return to her prior level of function with less limitation.     OBJECTIVE IMPAIRMENTS decreased mobility, decreased ROM, decreased strength, hypomobility, impaired flexibility, postural dysfunction, and pain.    ACTIVITY LIMITATIONS community activity, driving, and shopping.    PERSONAL FACTORS 1-2 comorbidities: osteopenia  are also affecting patient's functional outcome.          GOALS: Goals reviewed with patient? Yes   SHORT TERM GOALS:   STG Name Target Date Goal status  1 The pt will report understanding and adherence to  her HEP in order to promote independence in the management of her primary impairments. Baseline: HEP provided at eval 10/27/2021: Pt reports HEP adherence 10/16/2021 ACHIEVED    LONG TERM GOALS:    LTG Name Target Date Goal status  1 Pt will achieve BIL cervical rotation AROM of 70 degrees with 0-2/10 pain in order to drive without limitation. Baseline: 60 degrees BIL with 6/10 pain 11/04/2021: Achieved 11/13/2021 ACHIEVED  2 Pt will achieve a FOTO score of 61% in order to demonstrate improved functional ability as it relates to her primary impairments. Baseline: 47% at visit 4 10/27/2021: 59% 11/26/2021: 55% 12/24/2021: 56% 11/13/2021 PROGRESSING  3 Pt will achieve BIL global parascapular strength of 4+/5 in order to promote long-term postural improvement in the prophylactic care of future mechanical neck pain. Baseline: See MMT chart 10/27/2021: See updated MMT chart 11/11/2021: See updated MMT chart 12/09/2021: See updated MMT chart 12/24/2021: See updated MMT 11/13/2021 PROGRESSING  4 Pt will report ability to wear scarfs with 0/10 pain in order to dress for the cold weather. Baseline: Unable to wear scarfs >30 minutes due to increased neck pain 11/11/2021: Pt reports no need to wear scarfs at this time. 11/13/2021 DISCONTINUED    PLAN: PT FREQUENCY: 1x/week   PT DURATION: 8 weeks   PLANNED INTERVENTIONS: Therapeutic exercises, Therapeutic activity, Neuro Muscular re-education, Patient/Family education, Joint mobilization, Dry Needling, Spinal mobilization, Cryotherapy, Moist heat, Taping, Traction, and Manual therapy     PLAN FOR NEXT SESSION: progress DNF/ parascapular strengthening/ mobility, manual techniques for pain modulation    Vanessa Hartrandt, PT, DPT 01/13/22 1:28 PM

## 2022-01-15 DIAGNOSIS — L84 Corns and callosities: Secondary | ICD-10-CM | POA: Diagnosis not present

## 2022-01-15 DIAGNOSIS — M2041 Other hammer toe(s) (acquired), right foot: Secondary | ICD-10-CM | POA: Diagnosis not present

## 2022-01-19 DIAGNOSIS — H353132 Nonexudative age-related macular degeneration, bilateral, intermediate dry stage: Secondary | ICD-10-CM | POA: Diagnosis not present

## 2022-01-19 DIAGNOSIS — D3132 Benign neoplasm of left choroid: Secondary | ICD-10-CM | POA: Diagnosis not present

## 2022-01-19 DIAGNOSIS — H43813 Vitreous degeneration, bilateral: Secondary | ICD-10-CM | POA: Diagnosis not present

## 2022-01-20 DIAGNOSIS — H353132 Nonexudative age-related macular degeneration, bilateral, intermediate dry stage: Secondary | ICD-10-CM | POA: Diagnosis not present

## 2022-01-23 DIAGNOSIS — M79674 Pain in right toe(s): Secondary | ICD-10-CM | POA: Diagnosis not present

## 2022-01-23 DIAGNOSIS — M542 Cervicalgia: Secondary | ICD-10-CM | POA: Diagnosis not present

## 2022-01-24 ENCOUNTER — Ambulatory Visit: Payer: Medicare Other | Attending: Geriatric Medicine

## 2022-01-24 DIAGNOSIS — M542 Cervicalgia: Secondary | ICD-10-CM | POA: Insufficient documentation

## 2022-01-24 DIAGNOSIS — M6281 Muscle weakness (generalized): Secondary | ICD-10-CM | POA: Diagnosis not present

## 2022-01-24 NOTE — Therapy (Signed)
OUTPATIENT PHYSICAL THERAPY TREATMENT NOTE/ RE-CERTIFICATION   Patient Name: Cassandra Morrison MRN: [id number] DOB:02/22/1941, 81 y.o., female Today's Date: 01/24/2022  PCP: Stoneking, Hal, MD REFERRING PROVIDER: Stoneking, Hal, MD   PT End of Session - 01/24/22 1042     Visit Number 17    Number of Visits 19    Date for PT Re-Evaluation 02/11/22    Authorization Type MCR    Authorization Time Period FOTO v6, v10, kx modifier v15    Progress Note Due on Visit 20    PT Start Time 1042   Pt arrived 10 minutes late to her appointment.   PT Stop Time 1110    PT Time Calculation (min) 28 min    Activity Tolerance Patient tolerated treatment well    Behavior During Therapy WFL for tasks assessed/performed                          Past Medical History:  Diagnosis Date   Complication of anesthesia    nausea    CPAP (continuous positive airway pressure) dependence    Depression    Diverticulitis    GERD (gastroesophageal reflux disease)    Hypercholesterolemia    Nephrolithiasis    OAB (overactive bladder)    Obesity    Osteopenia    Sciatica    Sleep apnea    Past Surgical History:  Procedure Laterality Date   COLONOSCOPY WITH PROPOFOL N/A 05/20/2015   Procedure: COLONOSCOPY WITH PROPOFOL;  Surgeon: Martin K Johnson, MD;  Location: [facility];  Service: Endoscopy;  Laterality: N/A;   Patient Active Problem List   Diagnosis Date Noted   Spider veins 05/29/2015   Paroxysmal supraventricular tachycardia (HCC) 01/29/2014   Chest tightness 01/29/2014   REM sleep behavior disorder 06/09/2013   OSA on CPAP 06/09/2013   Obesity, unspecified 06/09/2013    REFERRING DIAG: M54.2 (ICD-10-CM) - Cervicalgia  THERAPY DIAG:  Neck pain  Muscle weakness (generalized)  PERTINENT HISTORY: osteopenia  PRECAUTIONS/RESTRICTIONS:   none  SUBJECTIVE:  Pt reports continued improvement in her cervical ROM and pain.   PAIN:  Are you having pain? Yes NPRS  scale: 0/10 Pain location: Posterior neck PAIN TYPE: aching Pain description: intermittent  Aggravating factors: Turning head, wearing scarf Relieving factors: pain medication  OBJECTIVE: *Unless otherwise noted, objective information collected previously*  FOTO: 47%, pedicted 61% in 11 visits 10/27/2021: 59% 11/26/2021: 55% 12/24/2021: 56%  CERVICAL AROM   AROM AROM (deg) 09/18/2021 AROM (Deg) 10/27/2021 AROM (Deg) 11/04/2021 AROM (Deg) 01/07/2022  Flexion 70     Extension 70     Right lateral flexion 30, stretch in Lt upper trap 25    Left lateral flexion 40 25    Right rotation 60p! 60p!, 68 with mild pain following MET 72 with 6-7/10 pain 82 with 2/10 pain  Left rotation 60p! 60p!, 70p! Following MET 70 with 5/10 pain 76 with 3-4/10 pain   (Blank rows = not tested)     UE MMT:   MMT Right 09/18/2021 Left 09/18/2021 Right 10/27/2021 Left 10/27/2021 Right 11/11/2021 Left 11/11/2021 Right 12/09/2021 Left 12/09/2021 Right 12/24/2021 Left 12/24/2021  Shoulder flexion 5/5 5/5          Shoulder abduction 5/5 5/5          Middle trapezius 4/5 4/5 4/5 4/5 5/5 4+/5 4/5 4+/5 4+/5 4/5  Lower trapezius 3/5 3/5 4/5 4/5 4/5 4/5 3+/5 4/5 4/5 4/5  Latissimus dorsi 4/5 4/5 4/5 4/5   5/5 5/5 4+/5 4+/5 4+/5 4+/5  Elbow flexion 5/5 5/5          Elbow extension 5/5 5/5          Wrist flexion 5/5 5/5          Wrist extension 5/5 5/5          Grip strength               (Blank rows = not tested)  FUNCTIONAL TESTS: 11/26/2021: 25# kettlebell lift from waist to overhead: Unable to lift past chest level   [facility] Adult PT Treatment:                                                DATE: 01/24/2022 Therapeutic Exercise: Supine DNF endurance lifts x3 to failure Supine cervical rotation with self-overpressure x10 with 3-sec hold BIL Seated rows with 35# cable 2x10 Seated high rows with 35# cable 2x10 Seated lat pull-downs with 30# cable 2x10 Manual Therapy: Supine manual cervical distraction x4  minutes Supine with towel under neck and STM to BIL UT Supine cervical rotation contract/ co-contract MET x3 with 1-min hold at end range BIL Neuromuscular re-ed: N/A Therapeutic Activity: N/A Modalities: N/A Self Care: N/A   [facility] Adult PT Treatment:                                                DATE: 01/13/2022 Therapeutic Exercise: Seated UBE x2 min forward/ x2 min backward while collecting subjective information Seated chin tucks with 7# cable to head attachment x20 Seated cervical side bend with 7# cable to head attachment x20 BIL Seated cervical rotation with 7# cable to head attachment x20 BIL Standing chin tucks x10 with 5-sec holds Seated UT stretch x19mn BIL Seated rows with 45# cable 2x10 Seated high rows with 45# cable 2x10 Seated lat pull-downs with 25# cable 2x10 Seated shoulder rolls 2x10 forward and backward Manual Therapy: N/A Neuromuscular re-ed: N/A Therapeutic Activity: N/A Modalities: N/A Self Care: N/A   [facility] Adult PT Treatment:                                                DATE: 01/07/2022 Therapeutic Exercise: Standing BIL shoulder scaption with 1# dumbbells with back against wall 3x12 Standing shoulder rolls with 1# dumbbells 3x10 forward and backward Seated rows with 45# cable 2x10 Seated high rows with 40# cable 2x10 Seated lat pull-downs with 35# cable 2x10 Manual Therapy: N/A Neuromuscular re-ed: N/A Therapeutic Activity: Re-assessment of objective measures with pt education on progress made in PT Pt education regarding POC and ways to progress home exercises Modalities: N/A Self Care: N/A     HOME EXERCISE PROGRAM: Access Code: [email or web address]: [email or web address] Date: 09/18/2021 Prepared by: TVanessa Day  Exercises Seated Cervical Retraction - 1 x daily - 7 x weekly - 3 sets - 10 reps Standing Shoulder Row with Anchored Resistance - 1 x daily - 7 x weekly - 3 sets - 10 reps Seated Upper Trapezius  Stretch - 1 x daily - 7 x weekly - 2 sets - 1-min  hold Seated Levator Scapulae Stretch - 1 x daily - 7 x weekly - 2 sets - 1-min hold    Added 11/04/2021:  - Seated Assisted Cervical Rotation with Towel  - 1 x daily - 7 x weekly - 10 reps - 5-sec hold - Cat Cow  - 1 x daily - 7 x weekly - 3 sets - 10 reps  Added 11/20/2021:  - Plank on Knees  - 1 x daily - 7 x weekly - 3 sets - 30-sec hold - Sidelying Open Book Thoracic Lumbar Rotation and Extension  - 1 x daily - 7 x weekly - 2 sets - 10 reps  Added 12/24/2021: - Standing Shoulder Scaption with Resistance  - 1 x daily - 7 x weekly - 3 sets - 10 reps - 3-sec hold - Shoulder extension with resistance - Neutral  - 1 x daily - 7 x weekly - 3 sets - 10 reps - 3-sec hold   ASSESSMENT:   CLINICAL IMPRESSION: Due to pt arriving 10 minutes late to her appointment, which led to a truncated treatment session today. She responded well to all manual techniques and tolerated exercises well. She will continue to benefit from skilled PT to address her primary impairments and return to her prior level of function with less limitation.     OBJECTIVE IMPAIRMENTS decreased mobility, decreased ROM, decreased strength, hypomobility, impaired flexibility, postural dysfunction, and pain.    ACTIVITY LIMITATIONS community activity, driving, and shopping.    PERSONAL FACTORS 1-2 comorbidities: osteopenia  are also affecting patient's functional outcome.          GOALS: Goals reviewed with patient? Yes   SHORT TERM GOALS:   STG Name Target Date Goal status  1 The pt will report understanding and adherence to her HEP in order to promote independence in the management of her primary impairments. Baseline: HEP provided at eval 10/27/2021: Pt reports HEP adherence 10/16/2021 ACHIEVED    LONG TERM GOALS:    LTG Name Target Date Goal status  1 Pt will achieve BIL cervical rotation AROM of 70 degrees with 0-2/10 pain in order to drive without  limitation. Baseline: 60 degrees BIL with 6/10 pain 11/04/2021: Achieved 11/13/2021 ACHIEVED  2 Pt will achieve a FOTO score of 61% in order to demonstrate improved functional ability as it relates to her primary impairments. Baseline: 47% at visit 4 10/27/2021: 59% 11/26/2021: 55% 12/24/2021: 56% 11/13/2021 PROGRESSING  3 Pt will achieve BIL global parascapular strength of 4+/5 in order to promote long-term postural improvement in the prophylactic care of future mechanical neck pain. Baseline: See MMT chart 10/27/2021: See updated MMT chart 11/11/2021: See updated MMT chart 12/09/2021: See updated MMT chart 12/24/2021: See updated MMT 11/13/2021 PROGRESSING  4 Pt will report ability to wear scarfs with 0/10 pain in order to dress for the cold weather. Baseline: Unable to wear scarfs >30 minutes due to increased neck pain 11/11/2021: Pt reports no need to wear scarfs at this time. 11/13/2021 DISCONTINUED    PLAN: PT FREQUENCY: 1x/week   PT DURATION: 8 weeks   PLANNED INTERVENTIONS: Therapeutic exercises, Therapeutic activity, Neuro Muscular re-education, Patient/Family education, Joint mobilization, Dry Needling, Spinal mobilization, Cryotherapy, Moist heat, Taping, Traction, and Manual therapy   PLAN FOR NEXT SESSION: progress DNF/ parascapular strengthening/ mobility, manual techniques for pain modulation    , , PT, DPT 01/24/22 11:12 AM     

## 2022-01-26 DIAGNOSIS — L84 Corns and callosities: Secondary | ICD-10-CM | POA: Diagnosis not present

## 2022-01-26 DIAGNOSIS — G609 Hereditary and idiopathic neuropathy, unspecified: Secondary | ICD-10-CM | POA: Diagnosis not present

## 2022-02-04 ENCOUNTER — Ambulatory Visit: Payer: Medicare Other

## 2022-02-04 DIAGNOSIS — M542 Cervicalgia: Secondary | ICD-10-CM | POA: Diagnosis not present

## 2022-02-04 DIAGNOSIS — M6281 Muscle weakness (generalized): Secondary | ICD-10-CM | POA: Diagnosis not present

## 2022-02-04 NOTE — Therapy (Signed)
OUTPATIENT PHYSICAL THERAPY TREATMENT NOTE/ RE-CERTIFICATION   Patient Name: Cassandra Morrison MRN: [id number] DOB:17-Dec-1940, 81 y.o., female Today's Date: 02/04/2022  PCP: Lajean Manes, MD REFERRING PROVIDER: Lajean Manes, MD   PT End of Session - 02/04/22 1831     Visit Number 18    Number of Visits 19    Date for PT Re-Evaluation 02/11/22    Authorization Type MCR    Authorization Time Period FOTO v6, v10, kx modifier v15    Progress Note Due on Visit 58    PT Start Time 1830    PT Stop Time 1910    PT Time Calculation (min) 40 min    Activity Tolerance Patient tolerated treatment well    Behavior During Therapy WFL for tasks assessed/performed                           Past Medical History:  Diagnosis Date   Complication of anesthesia    nausea    CPAP (continuous positive airway pressure) dependence    Depression    Diverticulitis    GERD (gastroesophageal reflux disease)    Hypercholesterolemia    Nephrolithiasis    OAB (overactive bladder)    Obesity    Osteopenia    Sciatica    Sleep apnea    Past Surgical History:  Procedure Laterality Date   COLONOSCOPY WITH PROPOFOL N/A 05/20/2015   Procedure: COLONOSCOPY WITH PROPOFOL;  Surgeon: Garlan Fair, MD;  Location: [facility];  Service: Endoscopy;  Laterality: N/A;   Patient Active Problem List   Diagnosis Date Noted   Spider veins 05/29/2015   Paroxysmal supraventricular tachycardia ([place]) 01/29/2014   Chest tightness 01/29/2014   REM sleep behavior disorder 06/09/2013   OSA on CPAP 06/09/2013   Obesity, unspecified 06/09/2013    REFERRING DIAG: M54.2 (ICD-10-CM) - Cervicalgia  THERAPY DIAG:  Neck pain  Muscle weakness (generalized)  PERTINENT HISTORY: osteopenia  PRECAUTIONS/RESTRICTIONS:   none  SUBJECTIVE:  Pt reports 0/10 pain today, adding she only has pain with end-range cervical rotation. She reports HEP adherence.  PAIN:  Are you having pain? Yes NPRS  scale: 0/10 Pain location: Posterior neck PAIN TYPE: aching Pain description: intermittent  Aggravating factors: Turning head, wearing scarf Relieving factors: pain medication  OBJECTIVE: *Unless otherwise noted, objective information collected previously*  FOTO: 47%, pedicted 61% in 11 visits 10/27/2021: 59% 11/26/2021: 55% 12/24/2021: 56% 02/04/2022: 58%  CERVICAL AROM   AROM AROM (deg) 09/18/2021 AROM (Deg) 10/27/2021 AROM (Deg) 11/04/2021 AROM (Deg) 01/07/2022  Flexion 70     Extension 70     Right lateral flexion 30, stretch in Lt upper trap 25    Left lateral flexion 40 25    Right rotation 60p! 60p!, 68 with mild pain following MET 72 with 6-7/10 pain 82 with 2/10 pain  Left rotation 60p! 60p!, 70p! Following MET 70 with 5/10 pain 76 with 3-4/10 pain   (Blank rows = not tested)     UE MMT:   MMT Right 09/18/2021 Left 09/18/2021 Right 10/27/2021 Left 10/27/2021 Right 11/11/2021 Left 11/11/2021 Right 12/09/2021 Left 12/09/2021 Right 12/24/2021 Left 12/24/2021  Shoulder flexion 5/5 5/5          Shoulder abduction 5/5 5/5          Middle trapezius 4/5 4/5 4/5 4/5 5/5 4+/5 4/5 4+/5 4+/5 4/5  Lower trapezius 3/5 3/5 4/5 4/5 4/5 4/5 3+/5 4/5 4/5 4/5  Latissimus dorsi 4/5 4/5 4/5  4/5 5/5 5/5 4+/5 4+/5 4+/5 4+/5  Elbow flexion 5/5 5/5          Elbow extension 5/5 5/5          Wrist flexion 5/5 5/5          Wrist extension 5/5 5/5          Grip strength               (Blank rows = not tested)  FUNCTIONAL TESTS: 11/26/2021: 25# kettlebell lift from waist to overhead: Unable to lift past chest level  [facility] Adult PT Treatment:                                                DATE: 02/04/2022 Therapeutic Exercise: Standing BIL shoulder scaption with 4# dumbbells 3x8 Standing arm circle angels 2x5 Seated rows with 35# cable 2x10 Seated high rows with 35# cable 2x10 Seated lat pull-downs with 35# cable 2x10 Seated shoulder rolls 2x10 forward and backward Alternating end-range  cervical rotation with overpressure 2x10 BIL Manual Therapy: Suboccipital release in supine x3 minutes STM to BIL UT and cervical paraspinals in supine x5 minutes Neuromuscular re-ed: N/A Therapeutic Activity: Re-administration of FOTO with pt education Double-arm 10# kettlebell lift to overhead cabinet 3x6 Modalities: N/A Self Care: N/A  [facility] Adult PT Treatment:                                                DATE: 01/24/2022 Therapeutic Exercise: Supine DNF endurance lifts x3 to failure Supine cervical rotation with self-overpressure x10 with 3-sec hold BIL Seated rows with 35# cable 2x10 Seated high rows with 35# cable 2x10 Seated lat pull-downs with 30# cable 2x10 Manual Therapy: Supine manual cervical distraction x4 minutes Supine with towel under neck and STM to BIL UT Supine cervical rotation contract/ co-contract MET x3 with 1-min hold at end range BIL Neuromuscular re-ed: N/A Therapeutic Activity: N/A Modalities: N/A Self Care: N/A   [facility] Adult PT Treatment:                                                DATE: 01/13/2022 Therapeutic Exercise: Seated UBE x2 min forward/ x2 min backward while collecting subjective information Seated chin tucks with 7# cable to head attachment x20 Seated cervical side bend with 7# cable to head attachment x20 BIL Seated cervical rotation with 7# cable to head attachment x20 BIL Standing chin tucks x10 with 5-sec holds Seated UT stretch x17mn BIL Seated rows with 45# cable 2x10 Seated high rows with 45# cable 2x10 Seated lat pull-downs with 25# cable 2x10 Seated shoulder rolls 2x10 forward and backward Manual Therapy: N/A Neuromuscular re-ed: N/A Therapeutic Activity: N/A Modalities: N/A Self Care: N/A      HOME EXERCISE PROGRAM: Access Code: [email or web address]: [email or web address] Date: 09/18/2021 Prepared by: TVanessa King [employer] Queen  Exercises Seated Cervical Retraction - 1 x daily - 7 x weekly - 3 sets -  10 reps Standing Shoulder Row with Anchored Resistance - 1 x daily - 7 x weekly - 3 sets - 10 reps Seated Upper Trapezius  Stretch - 1 x daily - 7 x weekly - 2 sets - 1-min hold Seated Levator Scapulae Stretch - 1 x daily - 7 x weekly - 2 sets - 1-min hold    Added 11/04/2021:  - Seated Assisted Cervical Rotation with Towel  - 1 x daily - 7 x weekly - 10 reps - 5-sec hold - Cat Cow  - 1 x daily - 7 x weekly - 3 sets - 10 reps  Added 11/20/2021:  - Plank on Knees  - 1 x daily - 7 x weekly - 3 sets - 30-sec hold - Sidelying Open Book Thoracic Lumbar Rotation and Extension  - 1 x daily - 7 x weekly - 2 sets - 10 reps  Added 12/24/2021: - Standing Shoulder Scaption with Resistance  - 1 x daily - 7 x weekly - 3 sets - 10 reps - 3-sec hold - Shoulder extension with resistance - Neutral  - 1 x daily - 7 x weekly - 3 sets - 10 reps - 3-sec hold   ASSESSMENT:   CLINICAL IMPRESSION: Pt responded well to all interventions today, demonstrating good form and no increase in pain with progressed exercises. She will continue to benefit from skilled PT to address her primary impairments and return to her prior level of function with less limitation. Will plan to D/C at next appointment after re-assessment of goals.     OBJECTIVE IMPAIRMENTS decreased mobility, decreased ROM, decreased strength, hypomobility, impaired flexibility, postural dysfunction, and pain.    ACTIVITY LIMITATIONS community activity, driving, and shopping.    PERSONAL FACTORS 1-2 comorbidities: osteopenia  are also affecting patient's functional outcome.          GOALS: Goals reviewed with patient? Yes   SHORT TERM GOALS:   STG Name Target Date Goal status  1 The pt will report understanding and adherence to her HEP in order to promote independence in the management of her primary impairments. Baseline: HEP provided at eval 10/27/2021: Pt reports HEP adherence 10/16/2021 ACHIEVED    LONG TERM GOALS:    LTG Name Target Date  Goal status  1 Pt will achieve BIL cervical rotation AROM of 70 degrees with 0-2/10 pain in order to drive without limitation. Baseline: 60 degrees BIL with 6/10 pain 11/04/2021: Achieved 11/13/2021 ACHIEVED  2 Pt will achieve a FOTO score of 61% in order to demonstrate improved functional ability as it relates to her primary impairments. Baseline: 47% at visit 4 10/27/2021: 59% 11/26/2021: 55% 12/24/2021: 56% 02/04/2022: 58% 11/13/2021 PROGRESSING  3 Pt will achieve BIL global parascapular strength of 4+/5 in order to promote long-term postural improvement in the prophylactic care of future mechanical neck pain. Baseline: See MMT chart 10/27/2021: See updated MMT chart 11/11/2021: See updated MMT chart 12/09/2021: See updated MMT chart 12/24/2021: See updated MMT 11/13/2021 PROGRESSING  4 Pt will report ability to wear scarfs with 0/10 pain in order to dress for the cold weather. Baseline: Unable to wear scarfs >30 minutes due to increased neck pain 11/11/2021: Pt reports no need to wear scarfs at this time. 11/13/2021 DISCONTINUED    PLAN: PT FREQUENCY: 1x/week   PT DURATION: 8 weeks   PLANNED INTERVENTIONS: Therapeutic exercises, Therapeutic activity, Neuro Muscular re-education, Patient/Family education, Joint mobilization, Dry Needling, Spinal mobilization, Cryotherapy, Moist heat, Taping, Traction, and Manual therapy   PLAN FOR NEXT SESSION: D/C    Vanessa Union, PT, DPT 02/04/22 7:12 PM

## 2022-02-11 ENCOUNTER — Ambulatory Visit: Payer: Medicare Other

## 2022-02-11 DIAGNOSIS — M6281 Muscle weakness (generalized): Secondary | ICD-10-CM

## 2022-02-11 DIAGNOSIS — M542 Cervicalgia: Secondary | ICD-10-CM

## 2022-02-11 NOTE — Therapy (Signed)
OUTPATIENT PHYSICAL THERAPY TREATMENT NOTE/ DISCHARGE SUMMARY   Patient Name: Cassandra Morrison MRN: [id number] DOB:June 13, 1941, 81 y.o., female Today's Date: 02/11/2022  PCP: Lajean Manes, MD REFERRING PROVIDER: Lajean Manes, MD   PT End of Session - 02/11/22 1302     Visit Number 19    Number of Visits 19    Date for PT Re-Evaluation 02/11/22    Authorization Type MCR    Authorization Time Period FOTO v6, v10, kx modifier v15    Progress Note Due on Visit 20    PT Start Time 1302    PT Stop Time 1340    PT Time Calculation (min) 38 min    Activity Tolerance Patient tolerated treatment well    Behavior During Therapy WFL for tasks assessed/performed                            Past Medical History:  Diagnosis Date   Complication of anesthesia    nausea    CPAP (continuous positive airway pressure) dependence    Depression    Diverticulitis    GERD (gastroesophageal reflux disease)    Hypercholesterolemia    Nephrolithiasis    OAB (overactive bladder)    Obesity    Osteopenia    Sciatica    Sleep apnea    Past Surgical History:  Procedure Laterality Date   COLONOSCOPY WITH PROPOFOL N/A 05/20/2015   Procedure: COLONOSCOPY WITH PROPOFOL;  Surgeon: Garlan Fair, MD;  Location: [facility];  Service: Endoscopy;  Laterality: N/A;   Patient Active Problem List   Diagnosis Date Noted   Spider veins 05/29/2015   Paroxysmal supraventricular tachycardia ([place]) 01/29/2014   Chest tightness 01/29/2014   REM sleep behavior disorder 06/09/2013   OSA on CPAP 06/09/2013   Obesity, unspecified 06/09/2013    REFERRING DIAG: M54.2 (ICD-10-CM) - Cervicalgia  THERAPY DIAG:  Neck pain  Muscle weakness (generalized)  PERTINENT HISTORY: osteopenia  PRECAUTIONS/RESTRICTIONS:   none  SUBJECTIVE:  Pt reports no pain today and adds that she has noticed improved functional cervical rotation BIL. She reports feeling ready to be discharged from  PT.  PAIN:  Are you having pain? Yes NPRS scale: 0/10 Pain location: Posterior neck PAIN TYPE: aching Pain description: intermittent  Aggravating factors: Turning head, wearing scarf Relieving factors: pain medication  OBJECTIVE: *Unless otherwise noted, objective information collected previously*  FOTO: 47%, pedicted 61% in 11 visits 10/27/2021: 59% 11/26/2021: 55% 12/24/2021: 56% 02/04/2022: 58%  CERVICAL AROM   AROM AROM (deg) 09/18/2021 AROM (Deg) 10/27/2021 AROM (Deg) 11/04/2021 AROM (Deg) 01/07/2022  Flexion 70     Extension 70     Right lateral flexion 30, stretch in Lt upper trap 25    Left lateral flexion 40 25    Right rotation 60p! 60p!, 68 with mild pain following MET 72 with 6-7/10 pain 82 with 2/10 pain  Left rotation 60p! 60p!, 70p! Following MET 70 with 5/10 pain 76 with 3-4/10 pain   (Blank rows = not tested)     UE MMT:   MMT Right 09/18/2021 Left 09/18/2021 Right 10/27/2021 Left 10/27/2021 Right 11/11/2021 Left 11/11/2021 Right 12/09/2021 Left 12/09/2021 Right 12/24/2021 Left 12/24/2021 Right 02/11/2022 Left 02/11/2022  Shoulder flexion 5/5 5/5            Shoulder abduction 5/5 5/5            Middle trapezius 4/5 4/5 4/5 4/5 5/5 4+/5 4/5 4+/5 4+/5 4/5 4+/5 4+/5  Lower trapezius 3/5 3/5 4/5 4/5 4/5 4/5 3+/5 4/5 4/5 4/5 4+/5 4+/5  Latissimus dorsi 4/5 4/5 4/5 4/5 5/5 5/5 4+/5 4+/5 4+/5 4+/5 4+/5 4+/5  Elbow flexion 5/5 5/5            Elbow extension 5/5 5/5            Wrist flexion 5/5 5/5            Wrist extension 5/5 5/5            Grip strength                 (Blank rows = not tested)  FUNCTIONAL TESTS: 11/26/2021: 25# kettlebell lift from waist to overhead: Unable to lift past chest level  [facility] Adult PT Treatment:                                                DATE: 02/11/2022 Therapeutic Exercise: Standing lat pull down with blue band 3x10 Standing BIL shoulder extension with blue band 3x10 Standing low rows with black band 3x10 Standing high rows  with black band 3x10 Manual Therapy: N/A Neuromuscular re-ed: N/A Therapeutic Activity: Re-assessment of objective measures with pt education Updated HEP with pt education on POC following discharge from PT Modalities: N/A Self Care: N/A   [facility] Adult PT Treatment:                                                DATE: 02/04/2022 Therapeutic Exercise: Standing BIL shoulder scaption with 4# dumbbells 3x8 Standing arm circle angels 2x5 Seated rows with 35# cable 2x10 Seated high rows with 35# cable 2x10 Seated lat pull-downs with 35# cable 2x10 Seated shoulder rolls 2x10 forward and backward Alternating end-range cervical rotation with overpressure 2x10 BIL Manual Therapy: Suboccipital release in supine x3 minutes STM to BIL UT and cervical paraspinals in supine x5 minutes Neuromuscular re-ed: N/A Therapeutic Activity: Re-administration of FOTO with pt education Double-arm 10# kettlebell lift to overhead cabinet 3x6 Modalities: N/A Self Care: N/A  [facility] Adult PT Treatment:                                                DATE: 01/24/2022 Therapeutic Exercise: Supine DNF endurance lifts x3 to failure Supine cervical rotation with self-overpressure x10 with 3-sec hold BIL Seated rows with 35# cable 2x10 Seated high rows with 35# cable 2x10 Seated lat pull-downs with 30# cable 2x10 Manual Therapy: Supine manual cervical distraction x4 minutes Supine with towel under neck and STM to BIL UT Supine cervical rotation contract/ co-contract MET x3 with 1-min hold at end range BIL Neuromuscular re-ed: N/A Therapeutic Activity: N/A Modalities: N/A Self Care: N/A     HOME EXERCISE PROGRAM: Access Code: [email or web address] URL: [email or web address] Date: 09/18/2021 Prepared by: Vanessa [place]   Exercises Seated Cervical Retraction - 1 x daily - 7 x weekly - 3 sets - 10 reps Standing Shoulder Row with Anchored Resistance - 1 x daily - 7 x weekly - 3 sets - 10  reps Seated Upper Trapezius Stretch - 1 x daily -  7 x weekly - 2 sets - 1-min hold Seated Levator Scapulae Stretch - 1 x daily - 7 x weekly - 2 sets - 1-min hold    Added 11/04/2021:  - Seated Assisted Cervical Rotation with Towel  - 1 x daily - 7 x weekly - 10 reps - 5-sec hold - Cat Cow  - 1 x daily - 7 x weekly - 3 sets - 10 reps  Added 11/20/2021:  - Plank on Knees  - 1 x daily - 7 x weekly - 3 sets - 30-sec hold - Sidelying Open Book Thoracic Lumbar Rotation and Extension  - 1 x daily - 7 x weekly - 2 sets - 10 reps  Added 12/24/2021: - Standing Shoulder Scaption with Resistance  - 1 x daily - 7 x weekly - 3 sets - 10 reps - 3-sec hold - Shoulder extension with resistance - Neutral  - 1 x daily - 7 x weekly - 3 sets - 10 reps - 3-sec hold  Added 02/11/2022: - Standing Lat Pull Down with Resistance - Elbows Bent  - 1 x daily - 7 x weekly - 3 sets - 10 reps   ASSESSMENT:   CLINICAL IMPRESSION: Upon re-assessment of objective measures, the pt has met or mostly met all of her functional rehab goals. She was provided an updated HEP to continue to perform daily maintenance following discharge from PT. She is discharged from PT at this time.     OBJECTIVE IMPAIRMENTS decreased mobility, decreased ROM, decreased strength, hypomobility, impaired flexibility, postural dysfunction, and pain.    ACTIVITY LIMITATIONS community activity, driving, and shopping.    PERSONAL FACTORS 1-2 comorbidities: osteopenia  are also affecting patient's functional outcome.          GOALS: Goals reviewed with patient? Yes   SHORT TERM GOALS:   STG Name Target Date Goal status  1 The pt will report understanding and adherence to her HEP in order to promote independence in the management of her primary impairments. Baseline: HEP provided at eval 10/27/2021: Pt reports HEP adherence 10/16/2021 ACHIEVED    LONG TERM GOALS:    LTG Name Target Date Goal status  1 Pt will achieve BIL cervical rotation AROM  of 70 degrees with 0-2/10 pain in order to drive without limitation. Baseline: 60 degrees BIL with 6/10 pain 11/04/2021: Achieved 11/13/2021 ACHIEVED  2 Pt will achieve a FOTO score of 61% in order to demonstrate improved functional ability as it relates to her primary impairments. Baseline: 47% at visit 4 10/27/2021: 59% 11/26/2021: 55% 12/24/2021: 56% 02/04/2022: 58% 11/13/2021 NEARLY MET  3 Pt will achieve BIL global parascapular strength of 4+/5 in order to promote long-term postural improvement in the prophylactic care of future mechanical neck pain. Baseline: See MMT chart 10/27/2021: See updated MMT chart 11/11/2021: See updated MMT chart 12/09/2021: See updated MMT chart 12/24/2021: See updated MMT 02/11/2022: 4+/5 globally 11/13/2021 ACHIEVED  4 Pt will report ability to wear scarfs with 0/10 pain in order to dress for the cold weather. Baseline: Unable to wear scarfs >30 minutes due to increased neck pain 11/11/2021: Pt reports no need to wear scarfs at this time. 11/13/2021 DISCONTINUED    PLAN: PT FREQUENCY: 1x/week   PT DURATION: 8 weeks   PLANNED INTERVENTIONS: Therapeutic exercises, Therapeutic activity, Neuro Muscular re-education, Patient/Family education, Joint mobilization, Dry Needling, Spinal mobilization, Cryotherapy, Moist heat, Taping, Traction, and Manual therapy   PLAN FOR NEXT SESSION: Pt is discharged  PHYSICAL THERAPY DISCHARGE SUMMARY  Visits from Start of Care: 19  Current functional level related to goals / functional outcomes: Pt has met or mostly met all of her rehab goals.   Remaining deficits: Low-level pain with end-range BIL cervical rotation AROM   Education / Equipment: HEP   Patient agrees to discharge. Patient goals were met. Patient is being discharged due to meeting the stated rehab goals.   Vanessa [place], PT, DPT 02/11/22 1:42 PM

## 2022-02-18 DIAGNOSIS — D3132 Benign neoplasm of left choroid: Secondary | ICD-10-CM | POA: Diagnosis not present

## 2022-02-18 DIAGNOSIS — H353132 Nonexudative age-related macular degeneration, bilateral, intermediate dry stage: Secondary | ICD-10-CM | POA: Diagnosis not present

## 2022-02-18 DIAGNOSIS — Z961 Presence of intraocular lens: Secondary | ICD-10-CM | POA: Diagnosis not present

## 2022-02-18 DIAGNOSIS — H52203 Unspecified astigmatism, bilateral: Secondary | ICD-10-CM | POA: Diagnosis not present

## 2022-02-19 DIAGNOSIS — H353132 Nonexudative age-related macular degeneration, bilateral, intermediate dry stage: Secondary | ICD-10-CM | POA: Diagnosis not present

## 2022-02-23 DIAGNOSIS — L84 Corns and callosities: Secondary | ICD-10-CM | POA: Diagnosis not present

## 2022-02-23 DIAGNOSIS — G609 Hereditary and idiopathic neuropathy, unspecified: Secondary | ICD-10-CM | POA: Diagnosis not present

## 2022-03-25 DIAGNOSIS — L84 Corns and callosities: Secondary | ICD-10-CM | POA: Diagnosis not present

## 2022-03-25 DIAGNOSIS — M79671 Pain in right foot: Secondary | ICD-10-CM | POA: Diagnosis not present

## 2022-03-25 DIAGNOSIS — G609 Hereditary and idiopathic neuropathy, unspecified: Secondary | ICD-10-CM | POA: Diagnosis not present

## 2022-03-25 DIAGNOSIS — M79672 Pain in left foot: Secondary | ICD-10-CM | POA: Diagnosis not present

## 2022-04-09 DIAGNOSIS — L84 Corns and callosities: Secondary | ICD-10-CM | POA: Diagnosis not present

## 2022-04-09 DIAGNOSIS — G609 Hereditary and idiopathic neuropathy, unspecified: Secondary | ICD-10-CM | POA: Diagnosis not present

## 2022-04-21 DIAGNOSIS — H353132 Nonexudative age-related macular degeneration, bilateral, intermediate dry stage: Secondary | ICD-10-CM | POA: Diagnosis not present

## 2022-04-22 DIAGNOSIS — Z23 Encounter for immunization: Secondary | ICD-10-CM | POA: Diagnosis not present

## 2022-04-30 DIAGNOSIS — H02205 Unspecified lagophthalmos left lower eyelid: Secondary | ICD-10-CM | POA: Diagnosis not present

## 2022-04-30 DIAGNOSIS — H02204 Unspecified lagophthalmos left upper eyelid: Secondary | ICD-10-CM | POA: Diagnosis not present

## 2022-05-12 DIAGNOSIS — D3132 Benign neoplasm of left choroid: Secondary | ICD-10-CM | POA: Diagnosis not present

## 2022-05-12 DIAGNOSIS — H353132 Nonexudative age-related macular degeneration, bilateral, intermediate dry stage: Secondary | ICD-10-CM | POA: Diagnosis not present

## 2022-05-12 DIAGNOSIS — H43813 Vitreous degeneration, bilateral: Secondary | ICD-10-CM | POA: Diagnosis not present

## 2022-05-29 DIAGNOSIS — M2042 Other hammer toe(s) (acquired), left foot: Secondary | ICD-10-CM | POA: Diagnosis not present

## 2022-05-29 DIAGNOSIS — G609 Hereditary and idiopathic neuropathy, unspecified: Secondary | ICD-10-CM | POA: Diagnosis not present

## 2022-05-29 DIAGNOSIS — L84 Corns and callosities: Secondary | ICD-10-CM | POA: Diagnosis not present

## 2022-06-20 DIAGNOSIS — Z23 Encounter for immunization: Secondary | ICD-10-CM | POA: Diagnosis not present

## 2022-06-26 DIAGNOSIS — L84 Corns and callosities: Secondary | ICD-10-CM | POA: Diagnosis not present

## 2022-06-26 DIAGNOSIS — G609 Hereditary and idiopathic neuropathy, unspecified: Secondary | ICD-10-CM | POA: Diagnosis not present

## 2022-06-28 IMAGING — CR DG CERVICAL SPINE COMPLETE 4+V
5 series · 5 of 5 positions shown · non-contrast
Comparison: None.

CLINICAL DATA: Neck pain.

EXAM:
CERVICAL SPINE - COMPLETE 4+ VIEW

[w c-spine lat *]
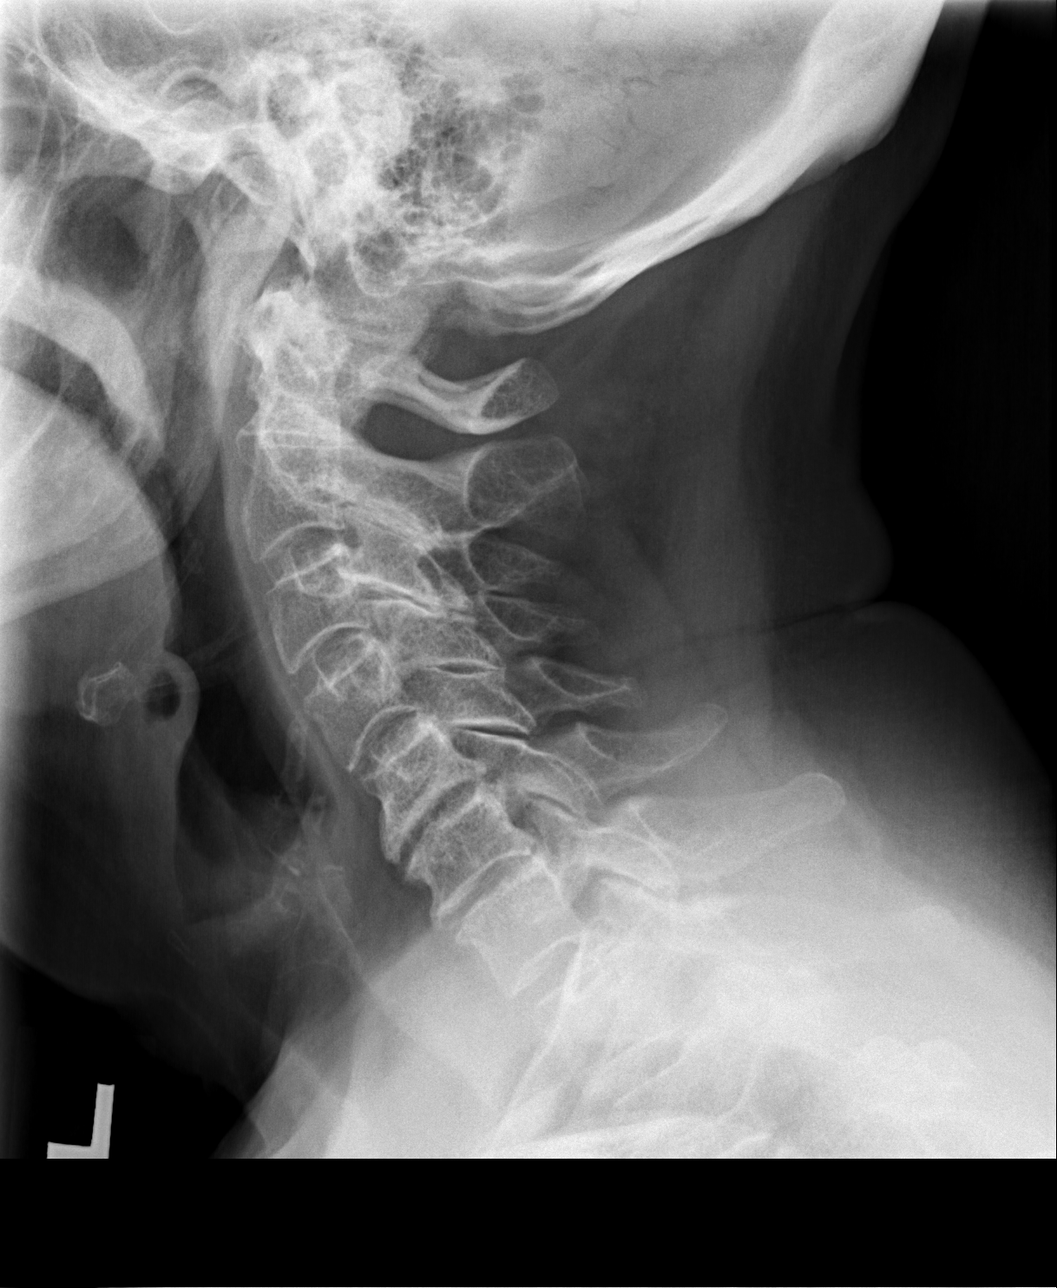

[w c-spine oblique * (1 of 2)]
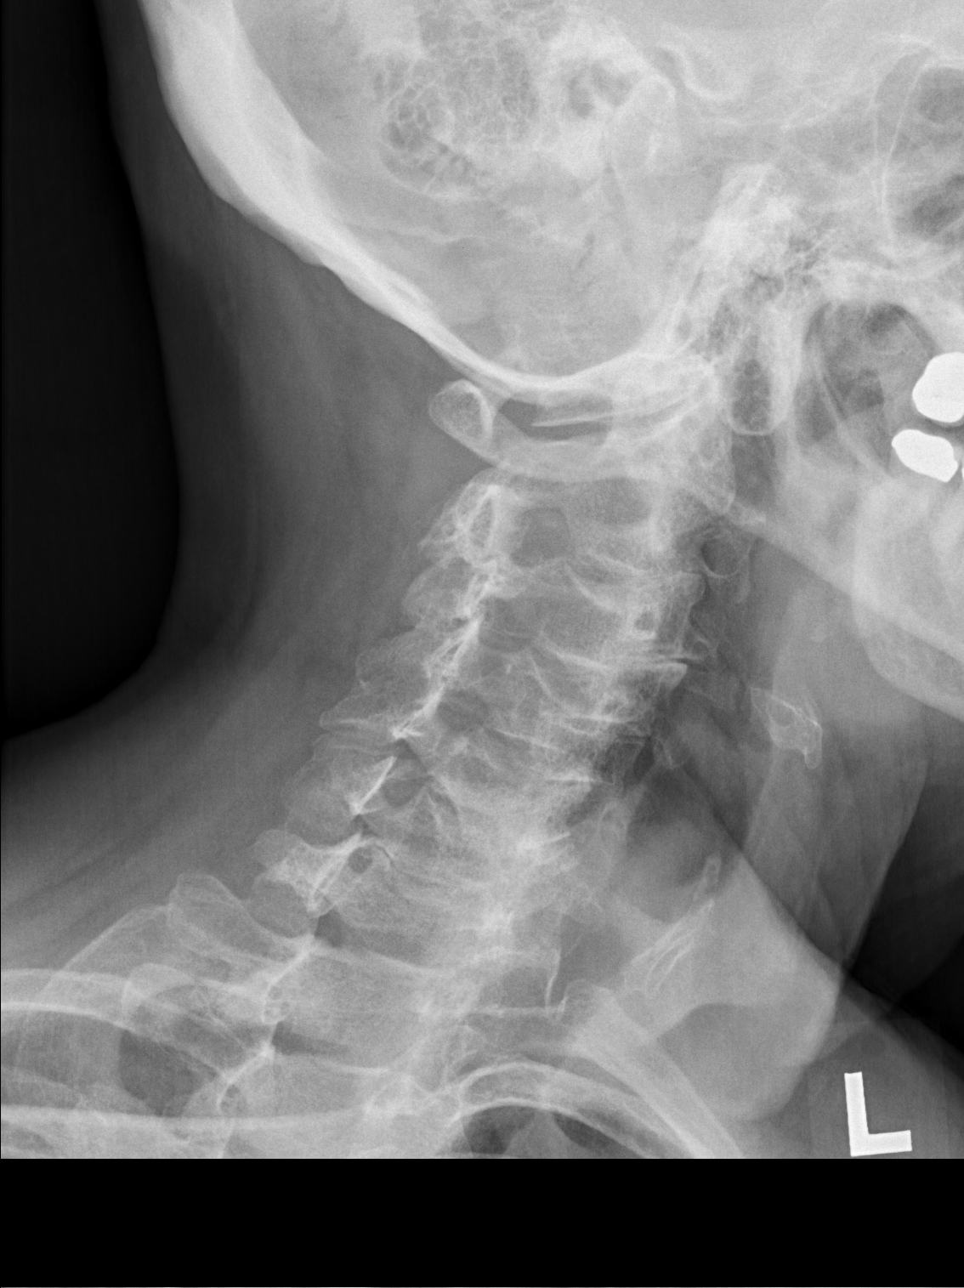

[w c-spine oblique * (2 of 2)]
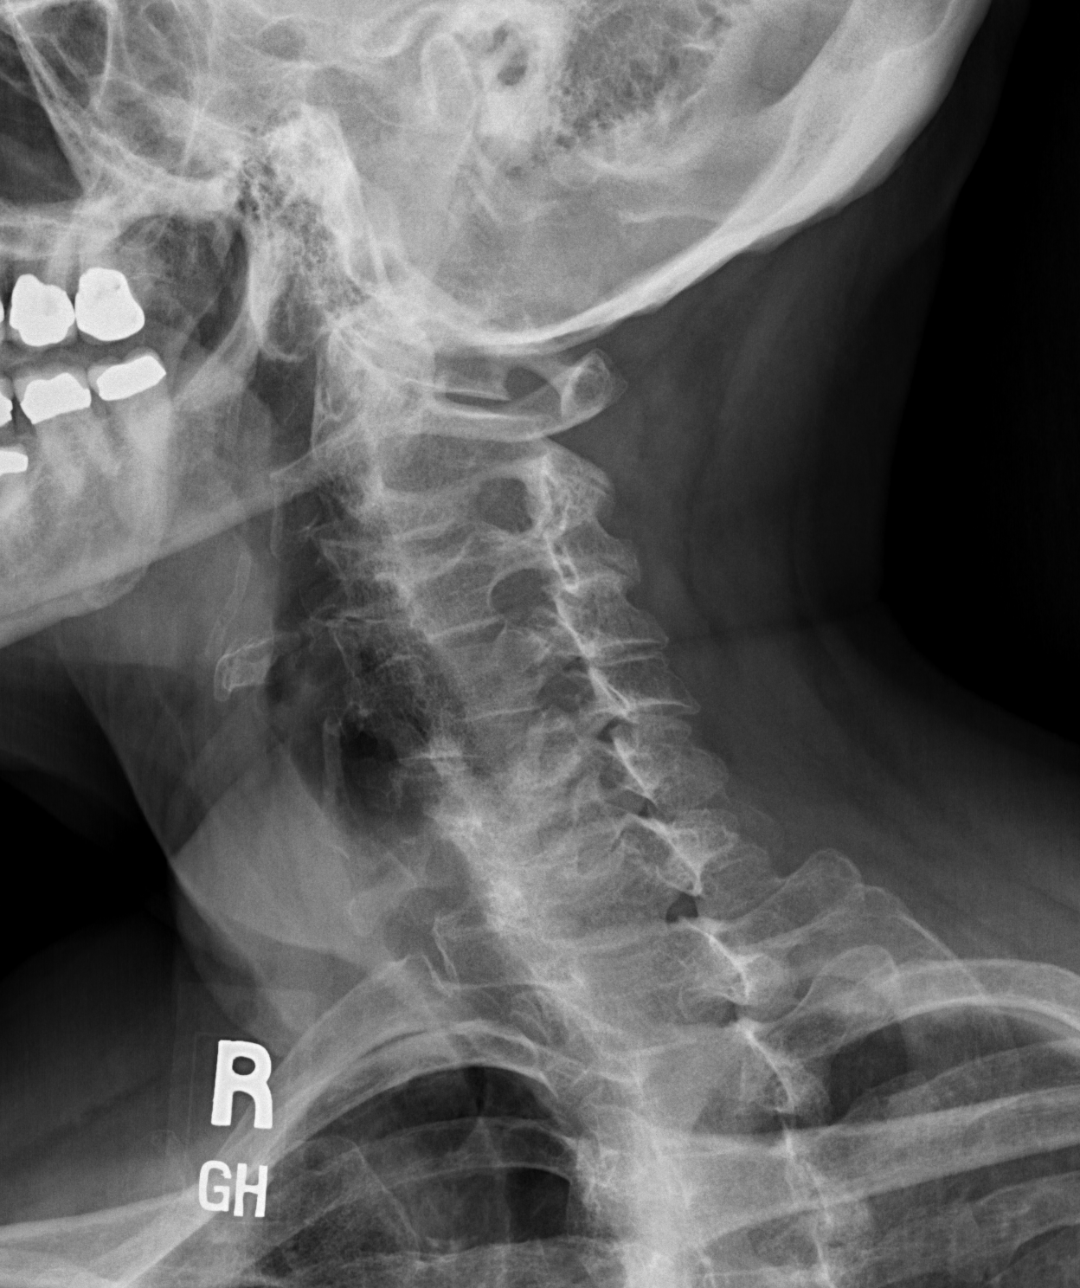

[w c-spine a.p.]
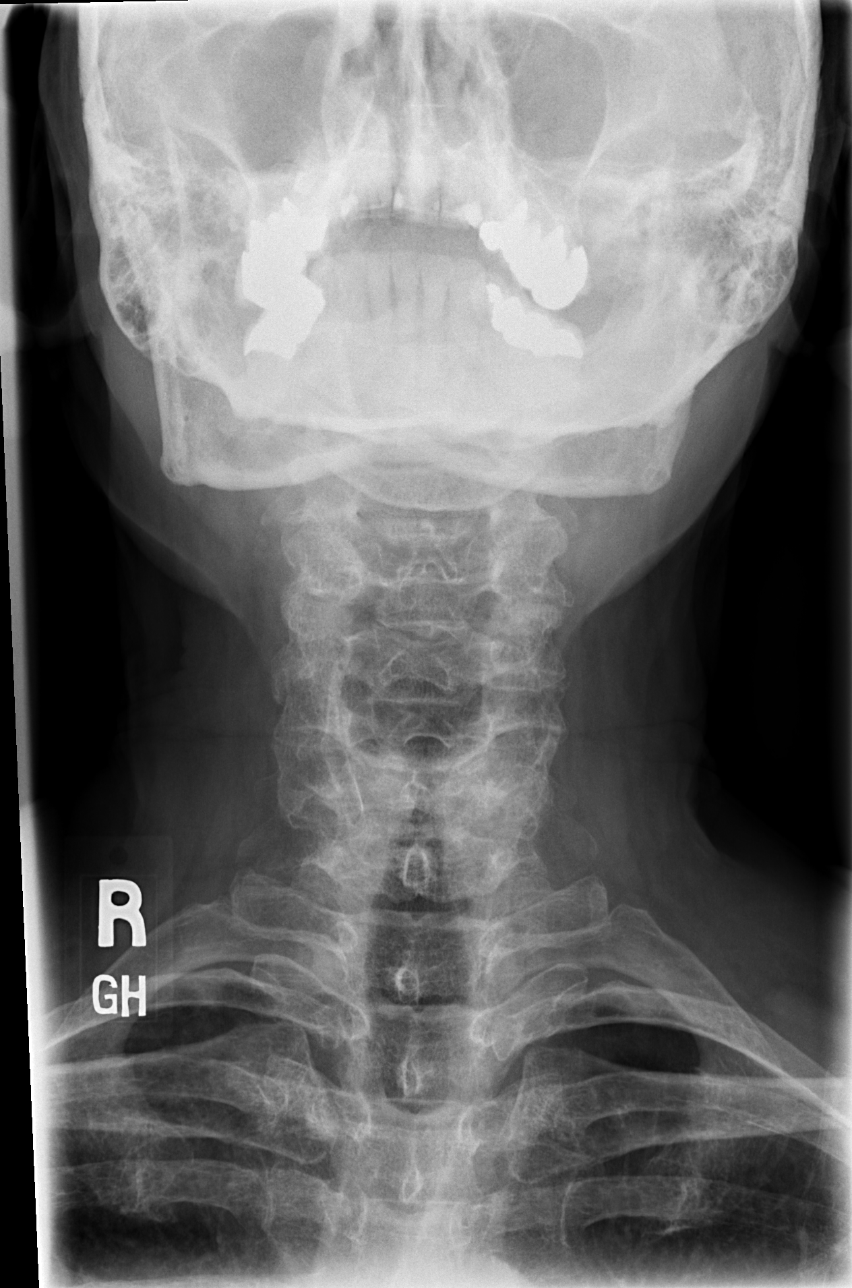

[w c-spine odontoid]
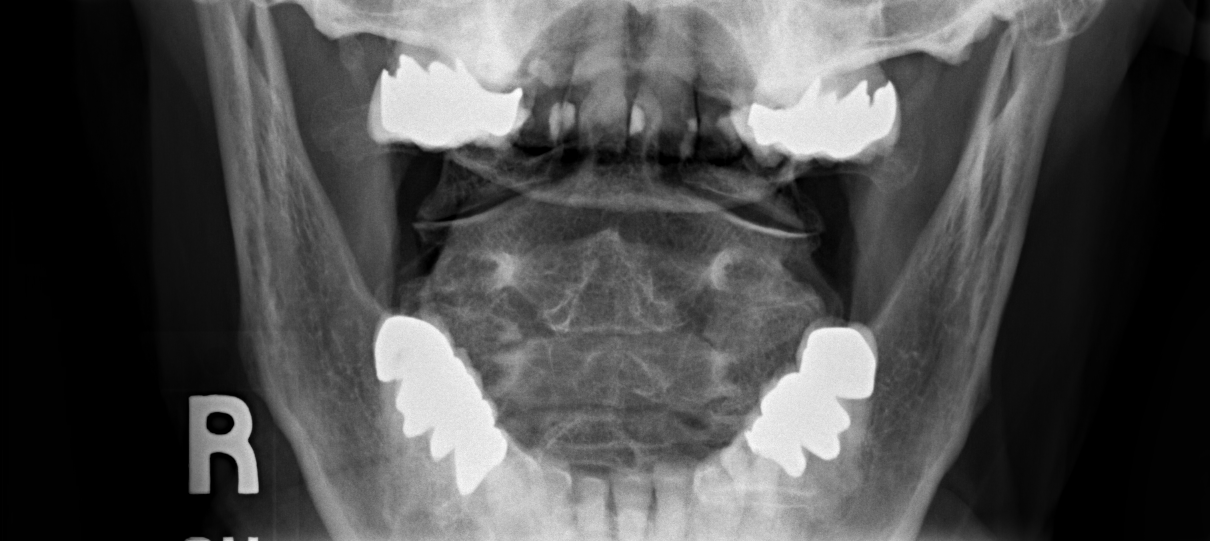

[5 of 5 positions shown; findings below may reference images not displayed]

FINDINGS: There is no acute fracture or subluxation of the cervical spine.
Multilevel degenerative changes most prominent at C5-C6 and C6-C7
the visualized posterior elements and odontoid appear intact. There
is anatomic alignment of the lateral masses of C1 and C2. The soft
tissues are unremarkable.
IMPRESSION: 1. No acute/traumatic cervical spine pathology.
2. Degenerative changes.

## 2022-06-29 DIAGNOSIS — M654 Radial styloid tenosynovitis [de Quervain]: Secondary | ICD-10-CM | POA: Diagnosis not present

## 2022-06-29 DIAGNOSIS — M1811 Unilateral primary osteoarthritis of first carpometacarpal joint, right hand: Secondary | ICD-10-CM | POA: Diagnosis not present

## 2022-06-29 DIAGNOSIS — M25531 Pain in right wrist: Secondary | ICD-10-CM | POA: Diagnosis not present

## 2022-07-04 DIAGNOSIS — H353132 Nonexudative age-related macular degeneration, bilateral, intermediate dry stage: Secondary | ICD-10-CM | POA: Diagnosis not present

## 2022-07-06 DIAGNOSIS — H04221 Epiphora due to insufficient drainage, right lacrimal gland: Secondary | ICD-10-CM | POA: Diagnosis not present

## 2022-07-15 DIAGNOSIS — M542 Cervicalgia: Secondary | ICD-10-CM | POA: Diagnosis not present

## 2022-10-09 ENCOUNTER — Other Ambulatory Visit: Payer: Self-pay | Admitting: Internal Medicine

## 2022-10-09 DIAGNOSIS — M8589 Other specified disorders of bone density and structure, multiple sites: Secondary | ICD-10-CM

## 2023-07-01 ENCOUNTER — Inpatient Hospital Stay: Admission: RE | Admit: 2023-07-01 | Payer: Medicare Other | Source: Ambulatory Visit
